# Patient Record
Sex: Female | Born: 1980
Health system: Southern US, Community
[De-identification: ages and names within clinical notes are randomized; demographics above are authoritative.]

## PROBLEM LIST (undated history)

## (undated) DIAGNOSIS — N200 Calculus of kidney: Secondary | ICD-10-CM

## (undated) DIAGNOSIS — K602 Anal fissure, unspecified: Secondary | ICD-10-CM

## (undated) HISTORY — DX: Anal fissure, unspecified: K60.2

## (undated) HISTORY — DX: Calculus of kidney: N20.0

---
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
## 2005-01-31 DIAGNOSIS — N200 Calculus of kidney: Secondary | ICD-10-CM | POA: Insufficient documentation

## 2005-01-31 HISTORY — DX: Calculus of kidney: N20.0

## 2005-08-31 DIAGNOSIS — N200 Calculus of kidney: Secondary | ICD-10-CM

## 2005-08-31 HISTORY — DX: Calculus of kidney: N20.0

## 2009-10-08 ENCOUNTER — Ambulatory Visit (HOSPITAL_COMMUNITY): Admission: RE | Admit: 2009-10-08 | Discharge: 2009-10-08 | Payer: Self-pay | Admitting: Obstetrics and Gynecology

## 2010-02-21 ENCOUNTER — Encounter: Payer: Self-pay | Admitting: Obstetrics and Gynecology

## 2010-03-20 ENCOUNTER — Inpatient Hospital Stay (HOSPITAL_COMMUNITY)
Admission: AD | Admit: 2010-03-20 | Discharge: 2010-03-21 | Disposition: A | Discharge status: Home / Self Care | Payer: BC Managed Care – PPO | Source: Ambulatory Visit | Attending: Obstetrics and Gynecology | Admitting: Obstetrics and Gynecology

## 2010-03-20 DIAGNOSIS — O4700 False labor before 37 completed weeks of gestation, unspecified trimester: Secondary | ICD-10-CM | POA: Insufficient documentation

## 2010-03-21 LAB — CBC
HCT: 38.1 % (ref 36.0–46.0)
Hemoglobin: 13 g/dL (ref 12.0–15.0)
MCV: 92.3 fL (ref 78.0–100.0)
RDW: 13 % (ref 11.5–15.5)
WBC: 14.6 10*3/uL — ABNORMAL HIGH (ref 4.0–10.5)

## 2010-03-21 LAB — URINALYSIS, ROUTINE W REFLEX MICROSCOPIC
Ketones, ur: NEGATIVE mg/dL
Nitrite: NEGATIVE
Protein, ur: NEGATIVE mg/dL
Urine Glucose, Fasting: NEGATIVE mg/dL
pH: 5 (ref 5.0–8.0)

## 2010-03-21 LAB — COMPREHENSIVE METABOLIC PANEL
ALT: 9 U/L (ref 0–35)
Alkaline Phosphatase: 96 U/L (ref 39–117)
BUN: 8 mg/dL (ref 6–23)
CO2: 22 mEq/L (ref 19–32)
GFR calc non Af Amer: >60 mL/min (ref >60)
Glucose, Bld: 94 mg/dL (ref 70–99)
Potassium: 3.9 mEq/L (ref 3.5–5.1)
Sodium: 135 mEq/L (ref 135–145)
Total Bilirubin: 0.2 mg/dL — ABNORMAL LOW (ref 0.3–1.2)

## 2010-03-21 LAB — URINE MICROSCOPIC-ADD ON

## 2010-03-21 LAB — URIC ACID: Uric Acid, Serum: 4.6 mg/dL (ref 2.4–7.0)

## 2010-03-21 LAB — WET PREP, GENITAL: Yeast Wet Prep HPF POC: NONE SEEN

## 2010-04-08 ENCOUNTER — Inpatient Hospital Stay (HOSPITAL_COMMUNITY)
Admission: AD | Admit: 2010-04-08 | Discharge: 2010-04-09 | DRG: 886 | Disposition: A | Discharge status: Home / Self Care | Payer: BC Managed Care – PPO | Source: Ambulatory Visit | Attending: Obstetrics and Gynecology | Admitting: Obstetrics and Gynecology

## 2010-04-08 DIAGNOSIS — O139 Gestational [pregnancy-induced] hypertension without significant proteinuria, unspecified trimester: Principal | ICD-10-CM | POA: Diagnosis present

## 2010-04-08 LAB — COMPREHENSIVE METABOLIC PANEL
ALT: 11 U/L (ref 0–35)
AST: 21 U/L (ref 0–37)
Albumin: 2.9 g/dL — ABNORMAL LOW (ref 3.5–5.2)
Alkaline Phosphatase: 102 U/L (ref 39–117)
Calcium: 9.1 mg/dL (ref 8.4–10.5)
GFR calc Af Amer: >60 mL/min (ref >60)
Glucose, Bld: 114 mg/dL — ABNORMAL HIGH (ref 70–99)
Potassium: 3.8 mEq/L (ref 3.5–5.1)
Sodium: 135 mEq/L (ref 135–145)
Total Protein: 5.9 g/dL — ABNORMAL LOW (ref 6.0–8.3)

## 2010-04-08 LAB — CBC
MCHC: 33.4 g/dL (ref 30.0–36.0)
Platelets: 181 10*3/uL (ref 150–400)
RDW: 12.8 % (ref 11.5–15.5)
WBC: 11 10*3/uL — ABNORMAL HIGH (ref 4.0–10.5)

## 2010-04-08 LAB — LACTATE DEHYDROGENASE: LDH: 122 U/L (ref 94–250)

## 2010-04-09 LAB — COMPREHENSIVE METABOLIC PANEL
ALT: 14 U/L (ref 0–35)
AST: 17 U/L (ref 0–37)
CO2: 23 mEq/L (ref 19–32)
Chloride: 106 mEq/L (ref 96–112)
Creatinine, Ser: 0.61 mg/dL (ref 0.4–1.2)
GFR calc Af Amer: >60 mL/min (ref >60)
GFR calc non Af Amer: >60 mL/min (ref >60)
Glucose, Bld: 75 mg/dL (ref 70–99)
Sodium: 135 mEq/L (ref 135–145)
Total Bilirubin: 0.7 mg/dL (ref 0.3–1.2)

## 2010-04-09 LAB — CBC
HCT: 39.2 % (ref 36.0–46.0)
Hemoglobin: 13.2 g/dL (ref 12.0–15.0)
MCH: 31.2 pg (ref 26.0–34.0)
RBC: 4.23 MIL/uL (ref 3.87–5.11)

## 2010-04-09 LAB — CREATININE CLEARANCE, URINE, 24 HOUR
Creatinine Clearance: 191 mL/min — ABNORMAL HIGH (ref 75–115)
Creatinine, 24H Ur: 1677 mg/d (ref 700–1800)

## 2010-04-09 LAB — PROTEIN, URINE, 24 HOUR
Collection Interval-UPROT: 24 hours
Protein, Urine: 9 mg/dL

## 2010-04-09 LAB — LACTATE DEHYDROGENASE: LDH: 114 U/L (ref 94–250)

## 2010-04-13 NOTE — Discharge Summary (Signed)
  NAME:  Heather Wallace, PETSCH                 ACCOUNT NO.:  [id number]  MEDICAL RECORD NO.:  [id number]           PATIENT TYPE:  I  LOCATION:  9154                          FACILITY:  [facility]  PHYSICIAN:  Sherron Monday, MD        DATE OF BIRTH:  [birth year]  DATE OF ADMISSION:  04/08/2010 DATE OF DISCHARGE:  04/09/2010                              DISCHARGE SUMMARY   ADMITTING DIAGNOSIS:  Elevated blood pressure in the office for rule out preeclampsia.  DISCHARGE DIAGNOSIS:  Pregnancy-induced hypertension  HISTORY OF PRESENT ILLNESS:  A 30 year old G2, P 0-1-0-1 at 73 and 3 by LMP with an EDC of May 03, 2010, seen by myself in the office with elevated blood pressures, admitted for labs and 24-hour urine.  She does complain of a dull headache.  She has received 17-hydroxyprogesterone caproate throughout her pregnancy for prior preterm delivery.  She is on p.r.n. Procardia for contractions as well.  PAST MEDICAL HISTORY:  Significant for kidney stones.  PAST SURGICAL HISTORY:  Significant for wisdom tooth extraction.  PAST GYN HISTORY:  G1 with a vaginal delivery at 35 weeks after 5-pound 8-ounce infant. G2 present  MEDICATIONS:  Prenatal vitamins.  ALLERGIES:  CECLOR which causes swelling.  SOCIAL HISTORY:  Denies alcohol, tobacco, or drug use.  FAMILY HISTORY:  In the prenatal records.  On admission, she had a benign exam with much improved blood pressures in the hospital.  Her lab evaluation was normal.  She was admitted and had a 24-hour urine.  Her 24-hour urine was within normal limits with total protein of 160 mg.  Following this, she was discharged to home with routine discharge instructions and numbers to call if any questions or problems as well as instructions to follow up on Monday and no longer work.  She voiced understanding to this.     Sherron Monday, MD    JB/MEDQ  D:  04/10/2010  T:  04/10/2010  Job:  [id number]  Electronically Signed by Sherron Monday MD on 04/13/2010  01:50:03 PM

## 2010-04-15 ENCOUNTER — Inpatient Hospital Stay (HOSPITAL_COMMUNITY)
Admission: AD | Admit: 2010-04-15 | Discharge: 2010-04-16 | DRG: 373 | Disposition: A | Discharge status: Home / Self Care | Payer: BC Managed Care – PPO | Source: Ambulatory Visit | Attending: Obstetrics and Gynecology | Admitting: Obstetrics and Gynecology

## 2010-04-15 LAB — CBC
MCH: 31.7 pg (ref 26.0–34.0)
MCHC: 34.3 g/dL (ref 30.0–36.0)
Platelets: 173 10*3/uL (ref 150–400)
RDW: 12.7 % (ref 11.5–15.5)

## 2010-04-15 LAB — COMPREHENSIVE METABOLIC PANEL
AST: 25 U/L (ref 0–37)
Albumin: 2.9 g/dL — ABNORMAL LOW (ref 3.5–5.2)
Calcium: 8.9 mg/dL (ref 8.4–10.5)
Creatinine, Ser: 0.63 mg/dL (ref 0.4–1.2)
GFR calc Af Amer: >60 mL/min (ref >60)
GFR calc non Af Amer: >60 mL/min (ref >60)
Total Protein: 5.8 g/dL — ABNORMAL LOW (ref 6.0–8.3)

## 2010-04-15 LAB — RPR: RPR Ser Ql: NONREACTIVE

## 2010-04-16 LAB — CBC
HCT: 35.2 % — ABNORMAL LOW (ref 36.0–46.0)
Hemoglobin: 11.7 g/dL — ABNORMAL LOW (ref 12.0–15.0)
MCH: 31.3 pg (ref 26.0–34.0)
MCHC: 33.2 g/dL (ref 30.0–36.0)
MCV: 94.1 fL (ref 78.0–100.0)
Platelets: 151 10*3/uL (ref 150–400)
RBC: 3.74 MIL/uL — ABNORMAL LOW (ref 3.87–5.11)
RDW: 12.9 % (ref 11.5–15.5)
WBC: 13.1 10*3/uL — ABNORMAL HIGH (ref 4.0–10.5)

## 2010-05-03 ENCOUNTER — Inpatient Hospital Stay (HOSPITAL_COMMUNITY): Admission: AD | Admit: 2010-05-03 | Payer: Self-pay | Admitting: Obstetrics and Gynecology

## 2010-05-04 ENCOUNTER — Inpatient Hospital Stay (HOSPITAL_COMMUNITY)
Admission: AD | Admit: 2010-05-04 | Discharge: 2010-05-04 | Disposition: A | Discharge status: Home / Self Care | Payer: BC Managed Care – PPO | Source: Ambulatory Visit | Attending: Obstetrics and Gynecology | Admitting: Obstetrics and Gynecology

## 2010-05-04 DIAGNOSIS — O99893 Other specified diseases and conditions complicating puerperium: Secondary | ICD-10-CM | POA: Insufficient documentation

## 2010-05-04 DIAGNOSIS — H7090 Unspecified mastoiditis, unspecified ear: Secondary | ICD-10-CM | POA: Insufficient documentation

## 2010-05-04 DIAGNOSIS — O9229 Other disorders of breast associated with pregnancy and the puerperium: Secondary | ICD-10-CM | POA: Insufficient documentation

## 2010-05-04 LAB — COMPREHENSIVE METABOLIC PANEL
Alkaline Phosphatase: 91 U/L (ref 39–117)
BUN: 6 mg/dL (ref 6–23)
Chloride: 103 mEq/L (ref 96–112)
Glucose, Bld: 126 mg/dL — ABNORMAL HIGH (ref 70–99)
Potassium: 3.5 mEq/L (ref 3.5–5.1)
Total Bilirubin: 0.6 mg/dL (ref 0.3–1.2)
Total Protein: 7.2 g/dL (ref 6.0–8.3)

## 2010-05-04 LAB — URINALYSIS, ROUTINE W REFLEX MICROSCOPIC
Ketones, ur: 15 mg/dL — AB
Leukocytes, UA: NEGATIVE
Nitrite: NEGATIVE
Protein, ur: 30 mg/dL — AB
Urobilinogen, UA: 2 mg/dL — ABNORMAL HIGH (ref 0.0–1.0)

## 2010-05-04 LAB — CBC
HCT: 41.6 % (ref 36.0–46.0)
MCV: 92.2 fL (ref 78.0–100.0)
RDW: 12 % (ref 11.5–15.5)
WBC: 7.5 10*3/uL (ref 4.0–10.5)

## 2010-05-04 LAB — DIFFERENTIAL
Eosinophils Relative: 0 % (ref 0–5)
Lymphocytes Relative: 7 % — ABNORMAL LOW (ref 12–46)
Lymphs Abs: 0.5 10*3/uL — ABNORMAL LOW (ref 0.7–4.0)

## 2010-05-04 LAB — URIC ACID: Uric Acid, Serum: 3.8 mg/dL (ref 2.4–7.0)

## 2010-05-06 ENCOUNTER — Other Ambulatory Visit: Payer: Self-pay | Admitting: Obstetrics and Gynecology

## 2010-05-06 ENCOUNTER — Ambulatory Visit
Admission: RE | Admit: 2010-05-06 | Discharge: 2010-05-06 | Disposition: A | Discharge status: Home / Self Care | Payer: BC Managed Care – PPO | Source: Ambulatory Visit | Attending: Obstetrics and Gynecology | Admitting: Obstetrics and Gynecology

## 2010-05-06 DIAGNOSIS — N63 Unspecified lump in unspecified breast: Secondary | ICD-10-CM

## 2010-05-06 DIAGNOSIS — N61 Mastitis without abscess: Secondary | ICD-10-CM

## 2010-05-20 ENCOUNTER — Ambulatory Visit
Admission: RE | Admit: 2010-05-20 | Discharge: 2010-05-20 | Disposition: A | Discharge status: Home / Self Care | Payer: BC Managed Care – PPO | Source: Ambulatory Visit | Attending: Obstetrics and Gynecology | Admitting: Obstetrics and Gynecology

## 2010-05-20 DIAGNOSIS — N61 Mastitis without abscess: Secondary | ICD-10-CM

## 2010-06-08 NOTE — Discharge Summary (Signed)
  NAME:  Heather Wallace, GLAAB                 ACCOUNT NO.:  [id number]  MEDICAL RECORD NO.:  [id number]           PATIENT TYPE:  I  LOCATION:  9133                          [facility]ACILITY:  [facility]  PHYSICIAN:  Sherron Monday, MD        DATE OF BIRTH:  [birth year]  DATE OF ADMISSION:  04/15/2010 DATE OF DISCHARGE:  04/16/2010                              DISCHARGE SUMMARY   ADMITTING DIAGNOSIS:  Intrauterine pregnancy at term with spontaneous rupture of membranes.  DISCHARGE DIAGNOSIS:  Intrauterine pregnancy at term with spontaneous rupture of membranes, delivered.  HISTORY OF PRESENT ILLNESS:  A 30 year old G2, P 1-0-0-1 at 37 plus weeks with spontaneous rupture of membranes.  She was recently admitted for preeclampsia rule out.  Throughout her pregnancy she has been receiving 17-hydroxyprogesterone caproate for history of an early delivery.  She has had loss of fluid at 5 o'clock this morning with clear fluid, no vaginal bleeding, occasional contractions, and good fetal movement.  PAST MEDICAL HISTORY:  Significant for renal calculi.  PAST SURGICAL HISTORY:  Significant for wisdom tooth extraction.  PAST OB/GYN HISTORY:  G1 was a 35-week female, infant after a PROM, 5 pounds 8 ounces.  G2 is present pregnancy.  No history of any abnormal Pap smears or sexually transmitted diseases.  MEDICATIONS:  Prenatal vitamins and Aldomet.  ALLERGIES:  CECLOR.  SOCIAL HISTORY:  Denies alcohol, tobacco, or drug use.  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  FAMILY HISTORY:  Significant for breast cancer and hypertension. Ultrasound at 18 weeks revealed normal anatomy, posterior placenta, and a female infant.  PRENATAL LABORATORIES:  She is A positive, antibody screen negative, hemoglobin 14.0.  Pap within normal limits, rubella immune, RPR nonreactive.  Urine culture negative, hepatitis C surface antigen negative, HIV negative, platelets 248,000.  Gonorrhea negative. Chlamydia negative.  Cystic fibrosis screen  negative.  First trimester screen AFP within normal limits.  Glucola of 96.  Group B strep was negative.  Her pressures were mildly elevated and were monitored, and was given Pitocin to augment her labor as well as an epidural for pain. She progressed rapidly to complete +3, pushed 3-4 contractions to deliver a viable female infant at 27 on May 16, 2010.  Apgars were 9 at 1 minute and 9 at 5 minutes and weight was 6 pounds 14 ounces. Placenta was expressed intact after cord blood collection.  Her postpartum course was relatively uncomplicated.  Hemoglobin decreased from 13.1 to 11.7.  She desired discharge to home on postpartum day #1. She was discharged home with routine discharge instructions including numbers to call with any questions or problems, as well as prescription for Motrin, Vicodin, and prenatal vitamins.  DISCHARGE INFORMATION:  A positive, rubella immune, breast-feeding.  She plans to start progesterone pills for contraception at her 6-week checkup.  Hemoglobin decreased from 13.1 to 11.7.     Sherron Monday, MD  JB/MEDQ  D:  05/19/2010  T:  05/20/2010  Job:  [id number]  Electronically Signed by Sherron Monday MD on 06/08/2010 08:45:52 AM

## 2010-09-13 ENCOUNTER — Ambulatory Visit (INDEPENDENT_AMBULATORY_CARE_PROVIDER_SITE_OTHER): Payer: BC Managed Care – PPO | Admitting: Surgery

## 2010-09-13 ENCOUNTER — Encounter (INDEPENDENT_AMBULATORY_CARE_PROVIDER_SITE_OTHER): Payer: Self-pay | Admitting: Surgery

## 2010-09-13 VITALS — BP 142/90 | HR 96 | Temp 98.1°F | Ht 66.5 in | Wt 153.4 lb

## 2010-09-13 DIAGNOSIS — K602 Anal fissure, unspecified: Secondary | ICD-10-CM

## 2010-09-13 DIAGNOSIS — K600 Acute anal fissure: Secondary | ICD-10-CM | POA: Insufficient documentation

## 2010-09-13 NOTE — Progress Notes (Signed)
Subjective:     Patient ID: Heather Wallace, female   DOB: [birth year], 30 y.o.   MRN: [id number]  HPI  This is a pleasant 30 year old female who is postpartum. She has been having sharp spasmodic rectal discomfort with bowel movements. She has also had blood when she wipes. She has tried Anusol cream without help. She is on a stool softener. She has no issues with continence. She has had no previous problems with anal discomfort. She is otherwise healthy. Review of Systems     Objective:   Physical Exam    On examination, her external anal skin is normal. There are no external hemorrhoids. I performed a digital examination. Her sphincter was tight and there is a question of a posterior anal fissure. I could not get in deep enough to fill her internal hemorrhoids. Assessment:     Symptomatic anal fissure    Plan:     I will try her on diltiazem cream, sitz baths, and MiraLax. Hopefully this will improve without need for sphincterotomy. I will see her back in 3-4 weeks.

## 2010-09-15 ENCOUNTER — Telehealth (INDEPENDENT_AMBULATORY_CARE_PROVIDER_SITE_OTHER): Payer: Self-pay | Admitting: General Surgery

## 2010-09-15 NOTE — Telephone Encounter (Signed)
Called in  Rx for Polyethylene Glycol # 527 gram to [facility] to [facility] fax # [phone] phone # [phone] Pt stated that it was cheaper then OTC

## 2010-10-12 ENCOUNTER — Ambulatory Visit (INDEPENDENT_AMBULATORY_CARE_PROVIDER_SITE_OTHER): Payer: BC Managed Care – PPO | Admitting: Surgery

## 2010-10-12 ENCOUNTER — Encounter (INDEPENDENT_AMBULATORY_CARE_PROVIDER_SITE_OTHER): Payer: Self-pay | Admitting: Surgery

## 2010-10-12 VITALS — BP 136/94 | HR 76

## 2010-10-12 DIAGNOSIS — K602 Anal fissure, unspecified: Secondary | ICD-10-CM

## 2010-10-12 NOTE — Progress Notes (Signed)
Subjective:     Patient ID: Heather Wallace, female   DOB: 1980-02-02, 30 y.o.   MRN: [id number]  HPI She is here for a followup of an anal fissure. She reports that her symptoms have resolved and she is doing her stool softeners and the diltiazem cream.  Review of Systems     Objective:   Physical Exam    On examination, the external perianal skin is normal. I deferred a digital examination to avoid discomfort Assessment:     Patient with healing anal fissure on conservative measures.    Plan:     She will stop the diltiazem cream when it runs out. I will see her back if she recurs. If not I will see her as needed

## 2011-05-26 DIAGNOSIS — R809 Proteinuria, unspecified: Secondary | ICD-10-CM

## 2011-05-26 HISTORY — DX: Proteinuria, unspecified: R80.9

## 2011-06-21 IMAGING — US US OB COMP LESS 14 WK
1 series · 14 of 19 positions shown · non-contrast
Comparison: none

OBSTETRICAL ULTRASOUND:
 This ultrasound exam was performed in the [HOSPITAL] Ultrasound Department.  The OB US report was generated in the AS system, and faxed to the ordering physician.  This report is also available in [HOSPITAL]?s Access[facility]ware and in [REDACTED] PACS.

[Series 1: us ob comp less 14 wks · 0.21mm/px · 14 of 19 slices shown]
[im 1/19]
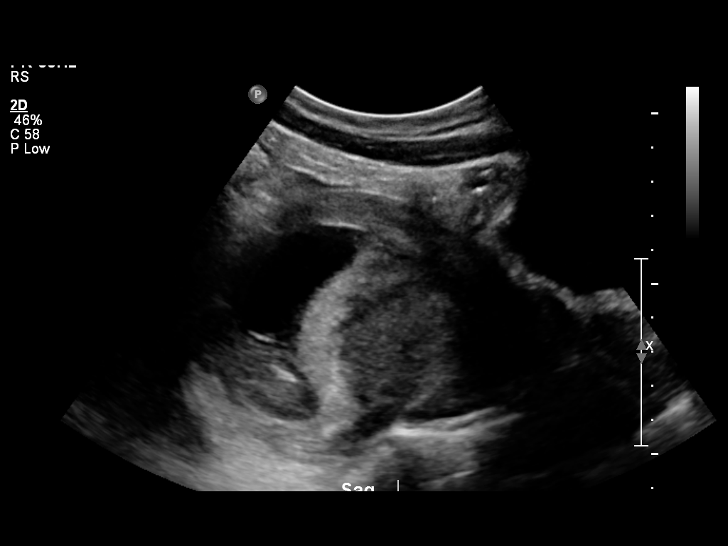
[im 3/19]
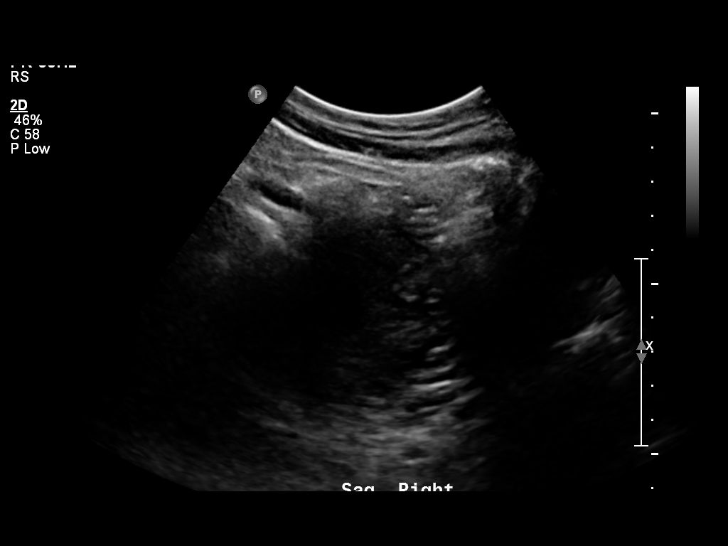
[im 4/19]
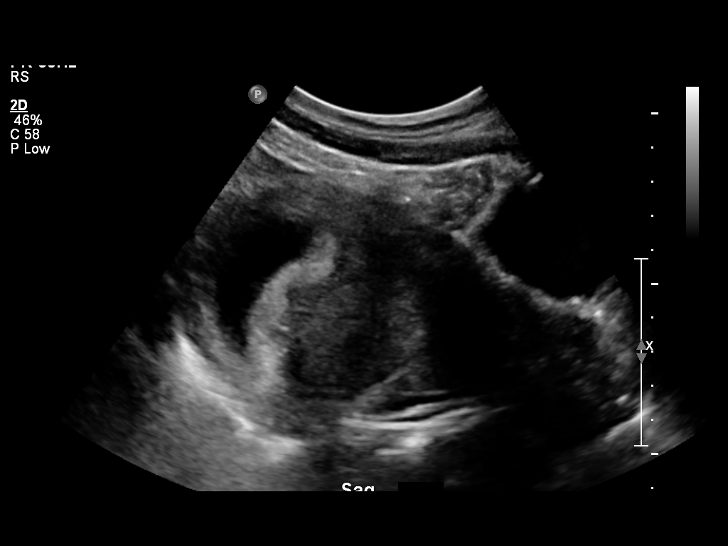
[im 5/19]
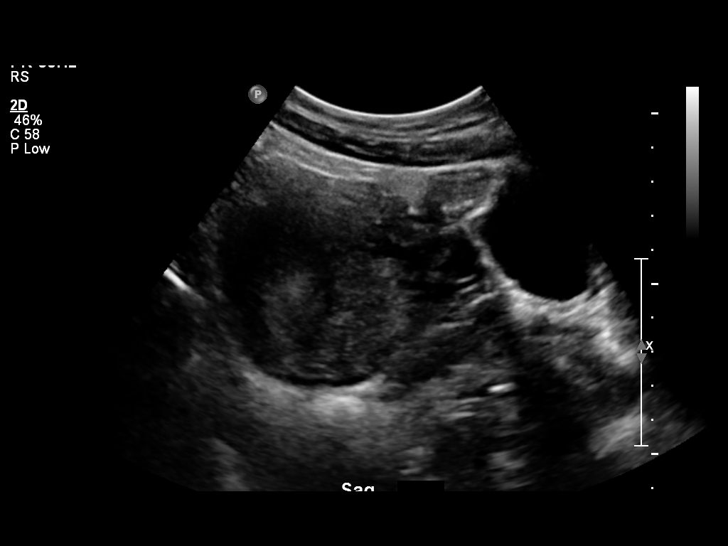
[im 7/19]
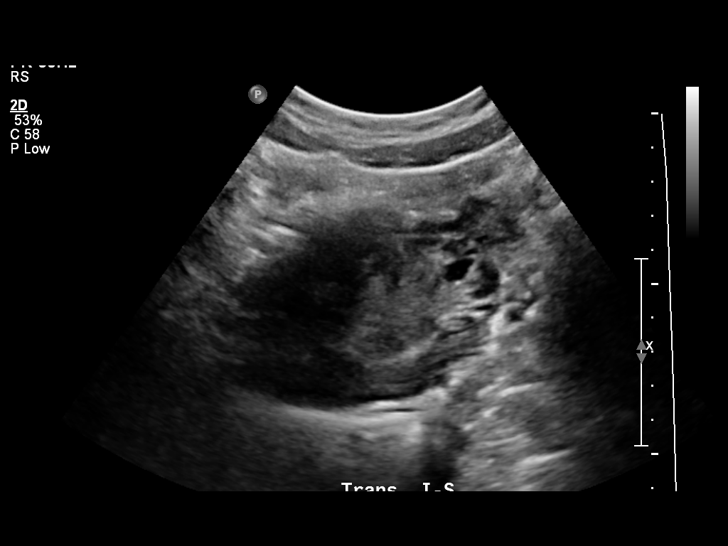
[im 8/19]
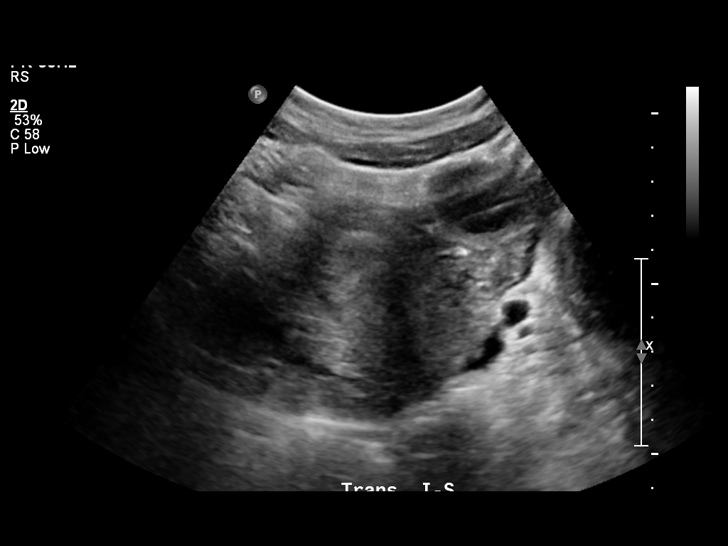
[im 9/19]
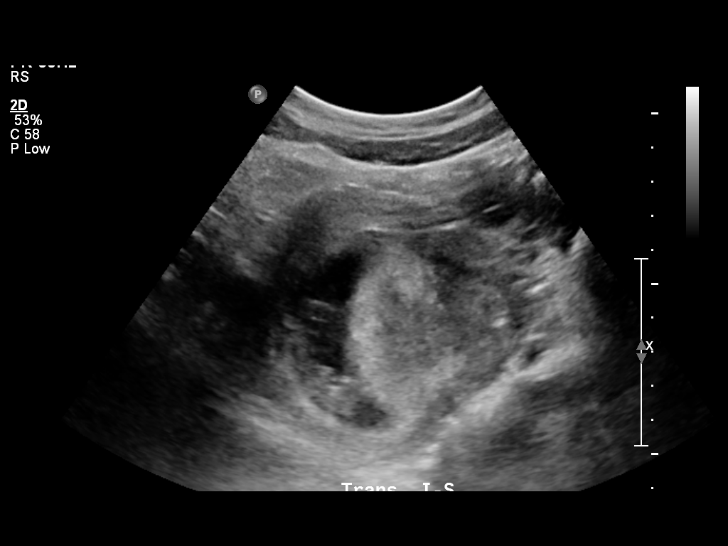
[im 11/19]
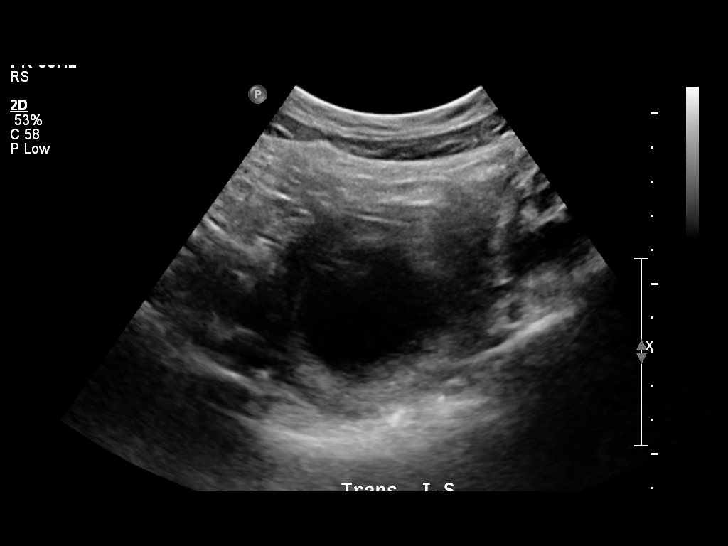
[im 12/19]
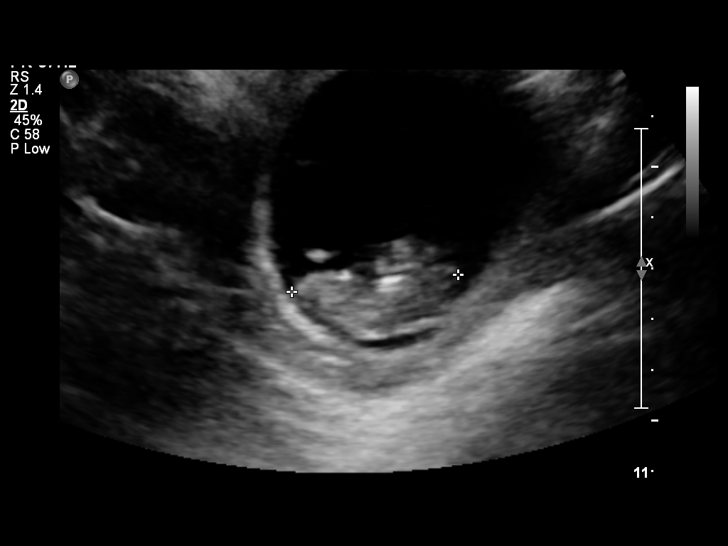
[im 13/19]
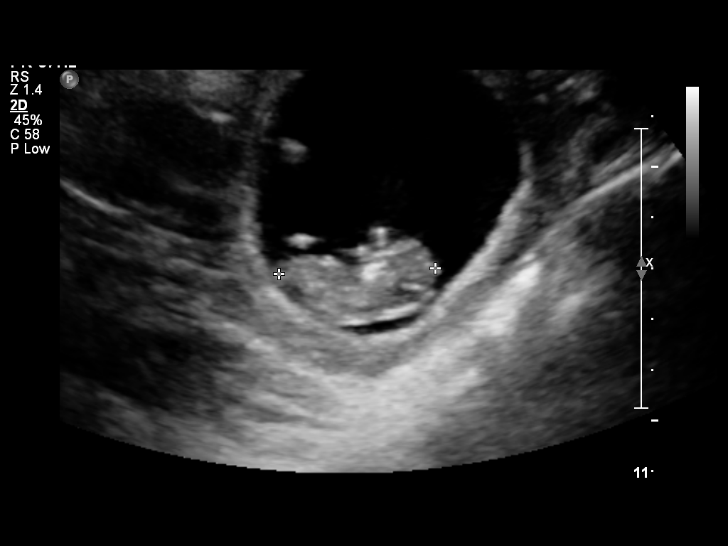
[im 15/19]
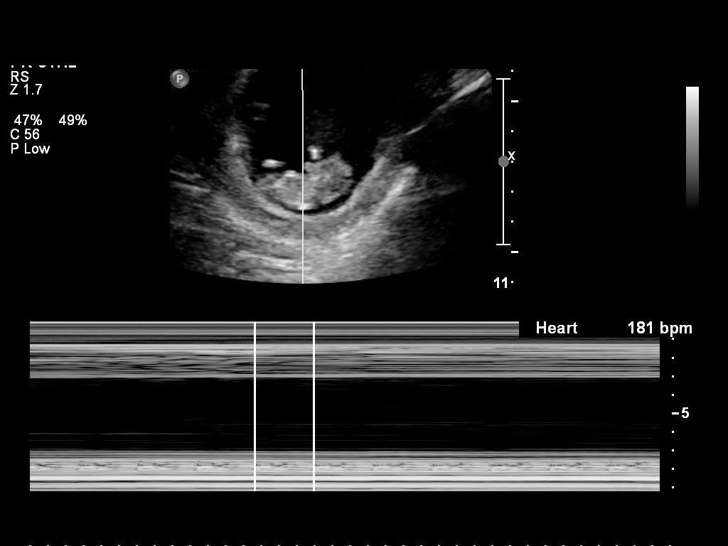
[im 16/19]
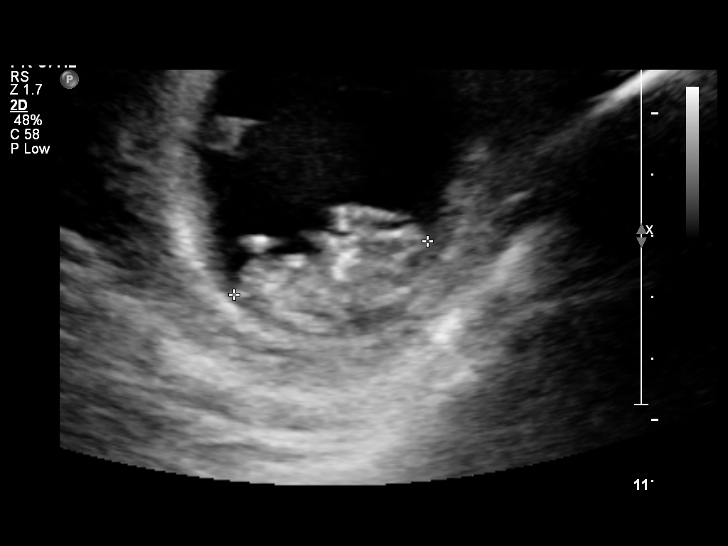
[im 17/19]
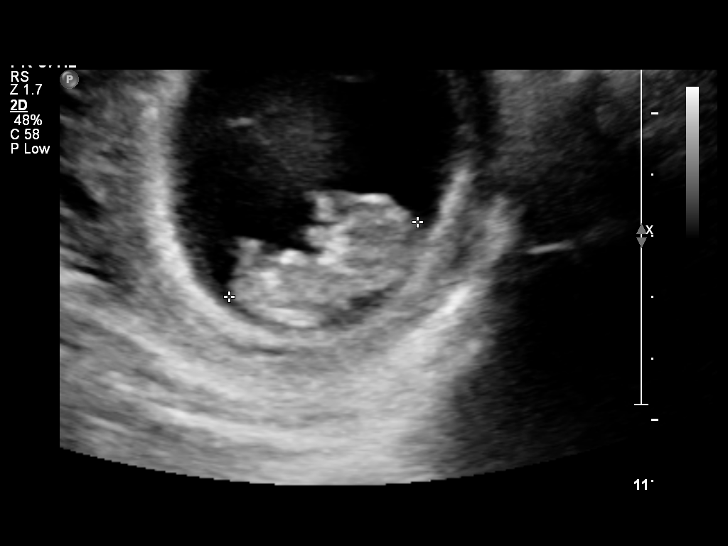
[im 19/19]
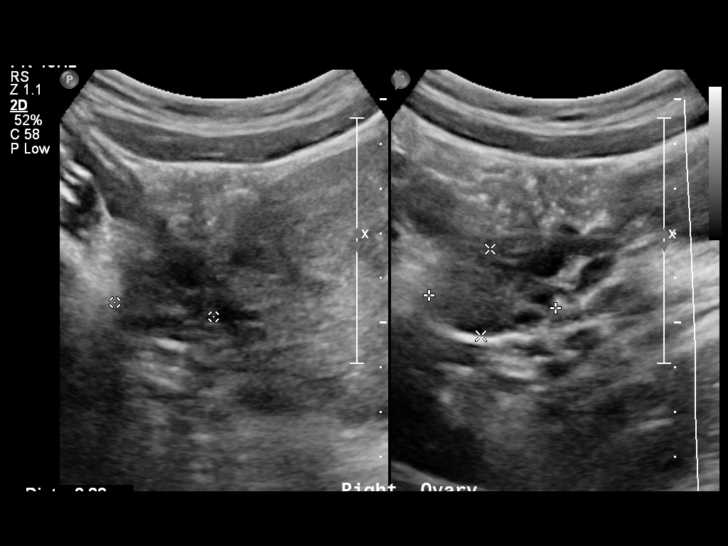

[14 of 19 positions shown; findings below may reference images not displayed]

IMPRESSION: See AS Obstetric US report.

## 2011-11-23 DIAGNOSIS — E785 Hyperlipidemia, unspecified: Secondary | ICD-10-CM | POA: Insufficient documentation

## 2016-04-25 DIAGNOSIS — I10 Essential (primary) hypertension: Secondary | ICD-10-CM | POA: Diagnosis not present

## 2016-04-25 DIAGNOSIS — Z01419 Encounter for gynecological examination (general) (routine) without abnormal findings: Secondary | ICD-10-CM | POA: Diagnosis not present

## 2016-04-25 DIAGNOSIS — Z6824 Body mass index (BMI) 24.0-24.9, adult: Secondary | ICD-10-CM | POA: Diagnosis not present

## 2016-07-06 DIAGNOSIS — I87393 Chronic venous hypertension (idiopathic) with other complications of bilateral lower extremity: Secondary | ICD-10-CM | POA: Diagnosis not present

## 2016-09-01 DIAGNOSIS — D225 Melanocytic nevi of trunk: Secondary | ICD-10-CM | POA: Diagnosis not present

## 2016-09-01 DIAGNOSIS — D485 Neoplasm of uncertain behavior of skin: Secondary | ICD-10-CM | POA: Diagnosis not present

## 2016-09-01 DIAGNOSIS — D2261 Melanocytic nevi of right upper limb, including shoulder: Secondary | ICD-10-CM | POA: Diagnosis not present

## 2016-09-01 DIAGNOSIS — L812 Freckles: Secondary | ICD-10-CM | POA: Diagnosis not present

## 2016-09-01 DIAGNOSIS — D2262 Melanocytic nevi of left upper limb, including shoulder: Secondary | ICD-10-CM | POA: Diagnosis not present

## 2016-11-14 ENCOUNTER — Other Ambulatory Visit: Payer: Self-pay | Admitting: Internal Medicine

## 2016-11-15 ENCOUNTER — Encounter: Payer: Self-pay | Admitting: Internal Medicine

## 2017-01-30 ENCOUNTER — Other Ambulatory Visit: Payer: Self-pay | Admitting: Internal Medicine

## 2017-01-30 DIAGNOSIS — Z1321 Encounter for screening for nutritional disorder: Secondary | ICD-10-CM

## 2017-01-30 DIAGNOSIS — Z0000 Encounter for general adult medical examination without abnormal findings: Secondary | ICD-10-CM

## 2017-01-30 DIAGNOSIS — Z1329 Encounter for screening for other suspected endocrine disorder: Secondary | ICD-10-CM

## 2017-01-30 DIAGNOSIS — Z13220 Encounter for screening for lipoid disorders: Secondary | ICD-10-CM

## 2017-02-13 ENCOUNTER — Other Ambulatory Visit: Payer: 59 | Admitting: Internal Medicine

## 2017-02-13 DIAGNOSIS — Z1329 Encounter for screening for other suspected endocrine disorder: Secondary | ICD-10-CM | POA: Diagnosis not present

## 2017-02-13 DIAGNOSIS — Z0000 Encounter for general adult medical examination without abnormal findings: Secondary | ICD-10-CM | POA: Diagnosis not present

## 2017-02-13 DIAGNOSIS — Z13220 Encounter for screening for lipoid disorders: Secondary | ICD-10-CM

## 2017-02-13 DIAGNOSIS — Z1321 Encounter for screening for nutritional disorder: Secondary | ICD-10-CM | POA: Diagnosis not present

## 2017-02-14 ENCOUNTER — Ambulatory Visit (INDEPENDENT_AMBULATORY_CARE_PROVIDER_SITE_OTHER): Payer: 59 | Admitting: Internal Medicine

## 2017-02-14 ENCOUNTER — Encounter: Payer: Self-pay | Admitting: Internal Medicine

## 2017-02-14 VITALS — BP 148/90 | HR 75 | Ht 66.0 in | Wt 155.0 lb

## 2017-02-14 DIAGNOSIS — Z0000 Encounter for general adult medical examination without abnormal findings: Secondary | ICD-10-CM | POA: Diagnosis not present

## 2017-02-14 DIAGNOSIS — R8290 Unspecified abnormal findings in urine: Secondary | ICD-10-CM

## 2017-02-14 DIAGNOSIS — I10 Essential (primary) hypertension: Secondary | ICD-10-CM | POA: Diagnosis not present

## 2017-02-14 LAB — CBC WITH DIFFERENTIAL/PLATELET
BASOS PCT: 0.3 %
Basophils Absolute: 23 cells/uL (ref 0–200)
EOS PCT: 1.3 %
Eosinophils Absolute: 99 cells/uL (ref 15–500)
HCT: 45 % (ref 35.0–45.0)
HEMOGLOBIN: 15.1 g/dL (ref 11.7–15.5)
Lymphs Abs: 1391 cells/uL (ref 850–3900)
MCH: 30.6 pg (ref 27.0–33.0)
MCHC: 33.6 g/dL (ref 32.0–36.0)
MCV: 91.3 fL (ref 80.0–100.0)
MONOS PCT: 6.1 %
MPV: 11.5 fL (ref 7.5–12.5)
NEUTROS ABS: 5624 {cells}/uL (ref 1500–7800)
Neutrophils Relative %: 74 %
PLATELETS: 247 10*3/uL (ref 140–400)
RBC: 4.93 10*6/uL (ref 3.80–5.10)
RDW: 11.7 % (ref 11.0–15.0)
TOTAL LYMPHOCYTE: 18.3 %
WBC mixed population: 464 cells/uL (ref 200–950)
WBC: 7.6 10*3/uL (ref 3.8–10.8)

## 2017-02-14 LAB — LIPID PANEL
CHOL/HDL RATIO: 2.9 (calc) (ref <5.0)
CHOLESTEROL: 160 mg/dL (ref <200)
HDL: 55 mg/dL (ref >50)
LDL CHOLESTEROL (CALC): 90 mg/dL
NON-HDL CHOLESTEROL (CALC): 105 mg/dL (ref <130)
TRIGLYCERIDES: 63 mg/dL (ref <150)

## 2017-02-14 LAB — VITAMIN D 25 HYDROXY (VIT D DEFICIENCY, FRACTURES): Vit D, 25-Hydroxy: 32 ng/mL (ref 30–100)

## 2017-02-14 LAB — POCT URINALYSIS DIPSTICK
APPEARANCE: NORMAL
BILIRUBIN UA: NEGATIVE
Glucose, UA: NEGATIVE
Ketones, UA: NEGATIVE
Nitrite, UA: NEGATIVE
ODOR: NORMAL
PH UA: 8 (ref 5.0–8.0)
PROTEIN UA: NEGATIVE
Spec Grav, UA: 1.01 (ref 1.010–1.025)
UROBILINOGEN UA: 0.2 U/dL

## 2017-02-14 LAB — COMPLETE METABOLIC PANEL WITH GFR
AG RATIO: 1.6 (calc) (ref 1.0–2.5)
ALKALINE PHOSPHATASE (APISO): 54 U/L (ref 33–115)
ALT: 9 U/L (ref 6–29)
AST: 13 U/L (ref 10–30)
Albumin: 4.5 g/dL (ref 3.6–5.1)
BILIRUBIN TOTAL: 0.5 mg/dL (ref 0.2–1.2)
BUN: 13 mg/dL (ref 7–25)
CHLORIDE: 103 mmol/L (ref 98–110)
CO2: 29 mmol/L (ref 20–32)
Calcium: 9.5 mg/dL (ref 8.6–10.2)
Creat: 0.74 mg/dL (ref 0.50–1.10)
GFR, Est African American: 122 mL/min/{1.73_m2} (ref >=60)
GFR, Est Non African American: 105 mL/min/{1.73_m2} (ref >=60)
GLOBULIN: 2.9 g/dL (ref 1.9–3.7)
Glucose, Bld: 80 mg/dL (ref 65–99)
POTASSIUM: 4.4 mmol/L (ref 3.5–5.3)
SODIUM: 138 mmol/L (ref 135–146)
Total Protein: 7.4 g/dL (ref 6.1–8.1)

## 2017-02-14 LAB — TSH: TSH: 0.68 mIU/L

## 2017-02-14 MED ORDER — AMLODIPINE BESYLATE 5 MG PO TABS: 5.0000 mg | ORAL_TABLET | Freq: Every day | ORAL | 3 refills | Status: DC

## 2017-02-14 NOTE — Patient Instructions (Addendum)
It was a pleasure to see you today.  Start Norvasc 5 mg daily and follow-up in 4 weeks.  Please have renal artery duplex scan in the near future.

## 2017-02-14 NOTE — Progress Notes (Signed)
Subjective:    Patient ID: Heather Wallace, female    DOB: 03-30-1980, 37 y.o.   MRN: [id number]  HPI  37 year old Female presents for the first time for health maintenance exam and evaluation of medical issues.  She has a history of elevated blood pressure started in her early 75s.  At that time she was on oral contraceptives for dysmenorrhea and menorrhagia.  She was in school at the time.  Oral contraceptives were stopped for a brief period of time but she continued to have significant menorrhagia restarted.  Her blood pressure was monitored.  She also had pregnancy-induced hypertension.  Her GYN has her blood pressure remains elevated.  She is comfortable with her on oral contraceptives for symptom management for birth control as her husband has had a vasectomy if patient will take antihypertensive medication.  Past history: History of kidney stone which she was traveling in [place].  Is never had recurrence.  Social history: She is married.  Husband is a Careers information officer.  She has a Transport planner.D degree from [employer] and works at [employer].  Family history: Both parents living age 59 in good health.  Father has hypertension and history of kidney stones and gout.  Brother age 15 in good health but has allergic rhinitis.  No sisters.  2 children daughter age 20 and a son age 48.  Mother is Artist.   Last year was training for a 5K marathon.  Since the holidays is not really been exercising.  No known drug allergies  Is currently on Junel FE 1/20 oral contraceptives and as needed Motrin.         Review of Systems  Respiratory: Negative.   Cardiovascular: Negative.   Gastrointestinal: Negative.   Genitourinary: Negative.   Neurological: Negative.   Psychiatric/Behavioral: Negative.        Objective:   Physical Exam  Constitutional: She is oriented to person, place, and time. She appears well-developed and well-nourished. No distress.  HENT:  Head: Normocephalic and  atraumatic.  Right Ear: External ear normal.  Left Ear: External ear normal.  Mouth/Throat: Oropharynx is clear and moist.  Eyes: Conjunctivae and EOM are normal. Pupils are equal, round, and reactive to light. Right eye exhibits no discharge. Left eye exhibits no discharge. No scleral icterus.  Neck: Neck supple. No JVD present. No thyromegaly present.  Cardiovascular: Normal rate, regular rhythm, normal heart sounds and intact distal pulses.  No murmur heard. Pulmonary/Chest: Effort normal and breath sounds normal. No respiratory distress. She has no wheezes. She has no rales.  Abdominal: Soft. Bowel sounds are normal. She exhibits no distension and no mass. There is no tenderness. There is no rebound and no guarding.  Genitourinary:  Genitourinary Comments: Deferred to GYN  Musculoskeletal: She exhibits no edema.  Lymphadenopathy:    She has no cervical adenopathy.  Neurological: She is alert and oriented to person, place, and time. She has normal reflexes. No cranial nerve deficit. Coordination normal.  Skin: Skin is warm and dry. No rash noted. She is not diaphoretic.  Psychiatric: She has a normal mood and affect. Her behavior is normal. Judgment and thought content normal.  Vitals reviewed.         Assessment & Plan:  Long-standing history of hypertension.  Initially was on oral contraceptives at the time of diagnosis.  Was reluctant to come off due to issues with menorrhagia.  Had pregnancy-induced hypertension as well.  Has never had workup  for renal artery stenosis.  Was young at onset of hypertension.  Recommend renal duplex scan.  Recommend Norvasc 5 mg daily and follow-up in 4 weeks.  She has had flu vaccine through employment.

## 2017-02-15 LAB — URINE CULTURE
MICRO NUMBER: 90060162
SPECIMEN QUALITY:: ADEQUATE

## 2017-02-21 ENCOUNTER — Encounter (HOSPITAL_COMMUNITY): Payer: Self-pay

## 2017-02-28 ENCOUNTER — Encounter (HOSPITAL_COMMUNITY): Payer: Self-pay

## 2017-02-28 ENCOUNTER — Ambulatory Visit (HOSPITAL_COMMUNITY)
Admission: RE | Admit: 2017-02-28 | Discharge: 2017-02-28 | Disposition: A | Discharge status: Home / Self Care | Payer: 59 | Source: Ambulatory Visit | Attending: Internal Medicine | Admitting: Internal Medicine

## 2017-02-28 DIAGNOSIS — I10 Essential (primary) hypertension: Secondary | ICD-10-CM

## 2017-02-28 NOTE — Progress Notes (Signed)
Renal artery duplex has been completed. Negative for obvious evidence of renal artery stenosis bilaterally.  02/28/17 9:39 AM Heather Wallace RVT

## 2017-03-16 ENCOUNTER — Encounter: Payer: Self-pay | Admitting: Internal Medicine

## 2017-03-16 ENCOUNTER — Ambulatory Visit (INDEPENDENT_AMBULATORY_CARE_PROVIDER_SITE_OTHER): Payer: 59 | Admitting: Internal Medicine

## 2017-03-16 VITALS — BP 148/90 | HR 88 | Ht 66.0 in | Wt 156.0 lb

## 2017-03-16 DIAGNOSIS — I10 Essential (primary) hypertension: Secondary | ICD-10-CM | POA: Diagnosis not present

## 2017-03-16 NOTE — Progress Notes (Signed)
   Subjective:    Patient ID: Heather Wallace, female    DOB: 1980/03/17, 37 y.o.   MRN: [id number]  HPI On January 29, had negative renal artery duplex study. Has been taking amlodipine regularly without side effects. Has not taken BP at home or work. Very busy at work with flu season.    Review of Systems see above     Objective:   Physical Exam BP  148/90 on arrival Rechecked 138/88       Assessment & Plan:  Could have element of office HTN with essential hypertension. She will monitor BP at home next 2-3 weeks and will follow up in March. She does not want to take diuretic because of potential for urinary frequency. If I increase amlodipine, she may experience leg edema especially since she is on her feet all day at work.

## 2017-03-16 NOTE — Patient Instructions (Signed)
Continue Norvasc and RTC in early March for follow up. Take BP at home

## 2017-04-24 ENCOUNTER — Encounter: Payer: Self-pay | Admitting: Internal Medicine

## 2017-04-24 ENCOUNTER — Ambulatory Visit: Payer: 59 | Admitting: Internal Medicine

## 2017-04-24 ENCOUNTER — Ambulatory Visit (INDEPENDENT_AMBULATORY_CARE_PROVIDER_SITE_OTHER): Payer: 59 | Admitting: Internal Medicine

## 2017-04-24 VITALS — BP 140/100 | HR 107 | Ht 66.0 in | Wt 148.0 lb

## 2017-04-24 DIAGNOSIS — I10 Essential (primary) hypertension: Secondary | ICD-10-CM

## 2017-04-24 NOTE — Progress Notes (Signed)
   Subjective:    Patient ID: Heather Wallace, female    DOB: April 02, 1980, 37 y.o.   MRN: [id number]  HPI She is off work today.  She ran 3 miles this morning.  Blood pressure was elevated on arrival at 140/100 with pulse of 107.  I would like to add something to amlodipine 5 mg daily.  Apply double amlodipine I am afraid she will have dependent edema.  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  She did run out of her blood pressure medication for a few days and the readings she brings them were elevated during that time but more recently over the last 3 days have been pretty normal.    Review of Systems     Objective:   Physical Exam Not examined today but spent 15 minutes speaking with her about blood pressure control and what options are available.  Talked about Bystolic.  She does run weekly about 3 miles.  Would like to run more but does not have time.       Assessment & Plan:  Essential hypertension  Plan: She will continue to keep an eye on her blood pressure and try not to run out of medication.  She will call in a couple of weeks with blood pressure readings.

## 2017-04-24 NOTE — Patient Instructions (Signed)
Continue Norvasc 5 mg daily and keep blood pressure readings.  Call with readings in a couple of weeks.

## 2017-06-15 DIAGNOSIS — Z01419 Encounter for gynecological examination (general) (routine) without abnormal findings: Secondary | ICD-10-CM | POA: Diagnosis not present

## 2017-06-15 DIAGNOSIS — Z1389 Encounter for screening for other disorder: Secondary | ICD-10-CM | POA: Diagnosis not present

## 2017-06-15 DIAGNOSIS — Z6824 Body mass index (BMI) 24.0-24.9, adult: Secondary | ICD-10-CM | POA: Diagnosis not present

## 2017-10-18 DIAGNOSIS — D225 Melanocytic nevi of trunk: Secondary | ICD-10-CM | POA: Diagnosis not present

## 2017-10-18 DIAGNOSIS — D2239 Melanocytic nevi of other parts of face: Secondary | ICD-10-CM | POA: Diagnosis not present

## 2017-10-18 DIAGNOSIS — L918 Other hypertrophic disorders of the skin: Secondary | ICD-10-CM | POA: Diagnosis not present

## 2018-01-11 ENCOUNTER — Other Ambulatory Visit: Payer: Self-pay | Admitting: Internal Medicine

## 2018-01-11 ENCOUNTER — Other Ambulatory Visit: Payer: Self-pay

## 2018-01-11 MED ORDER — AMLODIPINE BESYLATE 5 MG PO TABS: 5.0000 mg | ORAL_TABLET | Freq: Every day | ORAL | 0 refills | Status: DC

## 2018-01-11 NOTE — Telephone Encounter (Signed)
Call in for 90 days

## 2018-03-05 ENCOUNTER — Ambulatory Visit (INDEPENDENT_AMBULATORY_CARE_PROVIDER_SITE_OTHER): Payer: 59 | Admitting: Internal Medicine

## 2018-03-05 ENCOUNTER — Encounter: Payer: Self-pay | Admitting: Internal Medicine

## 2018-03-05 VITALS — BP 150/90 | HR 88 | Ht 66.0 in | Wt 148.0 lb

## 2018-03-05 DIAGNOSIS — Z1329 Encounter for screening for other suspected endocrine disorder: Secondary | ICD-10-CM | POA: Diagnosis not present

## 2018-03-05 DIAGNOSIS — Z13220 Encounter for screening for lipoid disorders: Secondary | ICD-10-CM

## 2018-03-05 DIAGNOSIS — I10 Essential (primary) hypertension: Secondary | ICD-10-CM | POA: Diagnosis not present

## 2018-03-05 DIAGNOSIS — Z0000 Encounter for general adult medical examination without abnormal findings: Secondary | ICD-10-CM

## 2018-03-05 DIAGNOSIS — E559 Vitamin D deficiency, unspecified: Secondary | ICD-10-CM

## 2018-03-05 NOTE — Patient Instructions (Signed)
Keep CPE appt.

## 2018-03-06 LAB — LIPID PANEL
CHOL/HDL RATIO: 2.8 (calc) (ref <5.0)
CHOLESTEROL: 173 mg/dL (ref <200)
HDL: 61 mg/dL (ref >=50)
LDL Cholesterol (Calc): 94 mg/dL (calc)
Non-HDL Cholesterol (Calc): 112 mg/dL (calc) (ref <130)
TRIGLYCERIDES: 88 mg/dL (ref <150)

## 2018-03-06 LAB — CBC WITH DIFFERENTIAL/PLATELET
Absolute Monocytes: 400 cells/uL (ref 200–950)
BASOS ABS: 28 {cells}/uL (ref 0–200)
Basophils Relative: 0.4 %
EOS ABS: 117 {cells}/uL (ref 15–500)
Eosinophils Relative: 1.7 %
HCT: 42.7 % (ref 35.0–45.0)
Hemoglobin: 14.4 g/dL (ref 11.7–15.5)
Lymphs Abs: 1325 cells/uL (ref 850–3900)
MCH: 30.8 pg (ref 27.0–33.0)
MCHC: 33.7 g/dL (ref 32.0–36.0)
MCV: 91.2 fL (ref 80.0–100.0)
MONOS PCT: 5.8 %
MPV: 11.5 fL (ref 7.5–12.5)
Neutro Abs: 5030 cells/uL (ref 1500–7800)
Neutrophils Relative %: 72.9 %
PLATELETS: 226 10*3/uL (ref 140–400)
RBC: 4.68 10*6/uL (ref 3.80–5.10)
RDW: 12 % (ref 11.0–15.0)
TOTAL LYMPHOCYTE: 19.2 %
WBC: 6.9 10*3/uL (ref 3.8–10.8)

## 2018-03-06 LAB — VITAMIN D 25 HYDROXY (VIT D DEFICIENCY, FRACTURES): VIT D 25 HYDROXY: 22 ng/mL — AB (ref 30–100)

## 2018-03-06 LAB — TSH: TSH: 0.76 mIU/L

## 2018-03-26 ENCOUNTER — Encounter: Payer: Self-pay | Admitting: Internal Medicine

## 2018-03-26 ENCOUNTER — Ambulatory Visit (INDEPENDENT_AMBULATORY_CARE_PROVIDER_SITE_OTHER): Payer: 59 | Admitting: Internal Medicine

## 2018-03-26 VITALS — BP 140/90 | HR 93 | Ht 66.0 in | Wt 161.0 lb

## 2018-03-26 DIAGNOSIS — E559 Vitamin D deficiency, unspecified: Secondary | ICD-10-CM | POA: Diagnosis not present

## 2018-03-26 DIAGNOSIS — I10 Essential (primary) hypertension: Secondary | ICD-10-CM | POA: Diagnosis not present

## 2018-03-26 DIAGNOSIS — Z0000 Encounter for general adult medical examination without abnormal findings: Secondary | ICD-10-CM

## 2018-03-26 DIAGNOSIS — IMO0001 Reserved for inherently not codable concepts without codable children: Secondary | ICD-10-CM | POA: Insufficient documentation

## 2018-03-26 DIAGNOSIS — Z8751 Personal history of pre-term labor: Secondary | ICD-10-CM

## 2018-03-26 HISTORY — DX: Personal history of pre-term labor: Z87.51

## 2018-03-26 LAB — POCT URINALYSIS DIPSTICK
APPEARANCE: NEGATIVE
BILIRUBIN UA: NEGATIVE
Glucose, UA: NEGATIVE
Ketones, UA: NEGATIVE
Nitrite, UA: NEGATIVE
Odor: NEGATIVE
PH UA: 7 (ref 5.0–8.0)
Protein, UA: NEGATIVE
SPEC GRAV UA: 1.015 (ref 1.010–1.025)
UROBILINOGEN UA: 0.2 U/dL

## 2018-03-26 MED ORDER — OLMESARTAN MEDOXOMIL 20 MG PO TABS: 20.0000 mg | ORAL_TABLET | Freq: Every day | ORAL | 1 refills | Status: DC

## 2018-03-26 NOTE — Progress Notes (Signed)
Subjective:    Patient ID: Heather Wallace, female    DOB: 06/06/1980, 38 y.o.   MRN: [id number]  HPI 38 year old Female in today for health maintenance exam and evaluation of medical issues.  Had tetanus immunization 2010.  Had flu vaccine August 2019.  History of elevated blood pressure starting in her early 35s.  At that time she was on oral contraceptives for dysmenorrhea and menorrhagia.  She was in school at that time.  Oral contraceptives were stopped for a brief period of time but she continued to have significant menorrhagia so these were restarted.  Her blood pressure was monitored.  She had pregnancy-induced hypertension.  Dr. Melba Coon is GYN physician.  Had Pap in 2017.  History of Lasik eye surgery and wisdom teeth extraction.  Currently on Junel 1/20 oral contraceptives to manage dysmenorrhea and menorrhagia.  History of kidney stone while she was traveling in [place].  Has never had recurrence.  Social history: She is married.  Husband is a Careers information officer.  She has a Transport planner.D. degree from [employer] and works at [employer]  Family history: Both parents living age 76 in good health.  Father has hypertension and history of kidney stones as well as gout.  Brother late 42s in good health but has allergic rhinitis.  No sisters.  2 children -daughter age 31 and a son age 23.  Patient's mother is Heather Wallace who is also a patient here.  No known drug allergies  In January 2019 was started on Norvasc for elevated blood pressure 148/90.  She tolerated this well.  She had a negative renal artery duplex study.  She was seen again in March of blood pressure was still elevated on amlodipine at 148/100.  She did not want to take a diuretic.  She runs frequently.  Did not want to have urinary frequency or volume depletion.  Labs reviewed-she has vitamin D deficiency with a level being 22.  Lipid panel and TSH are normal.  CBC is normal.  Has 2+ LE on urine dipstick but is  asymptomatic. Culture was not sent Review of Systems see above-     Objective:   Physical Exam Vitals signs reviewed.  Constitutional:      General: She is not in acute distress.    Appearance: Normal appearance.  HENT:     Head: Normocephalic.     Right Ear: Tympanic membrane normal.     Left Ear: Tympanic membrane normal.     Nose: Nose normal.     Mouth/Throat:     Mouth: Mucous membranes are moist.     Pharynx: Oropharynx is clear.  Eyes:     General: No scleral icterus.    Conjunctiva/sclera: Conjunctivae normal.     Pupils: Pupils are equal, round, and reactive to light.  Neck:     Musculoskeletal: Neck supple. No neck rigidity.     Comments: No thyromegaly Cardiovascular:     Rate and Rhythm: Normal rate and regular rhythm.     Pulses: Normal pulses.     Heart sounds: Normal heart sounds. No murmur.  Pulmonary:     Effort: No respiratory distress.     Breath sounds: Normal breath sounds. No wheezing.     Comments: Breast without masses Abdominal:     General: Bowel sounds are normal.     Palpations: Abdomen is soft. There is no mass.     Tenderness: There is no guarding.  Genitourinary:    Comments:  Deferred to GYN Musculoskeletal:     Right lower leg: No edema.     Left lower leg: No edema.  Lymphadenopathy:     Cervical: No cervical adenopathy.  Skin:    General: Skin is warm and dry.  Neurological:     General: No focal deficit present.     Mental Status: She is alert.  Psychiatric:        Mood and Affect: Mood normal.        Behavior: Behavior normal.        Thought Content: Thought content normal.    Blood pressure 140/90.  BMI 25.99 weight 161 pounds.  Pulse 93.  Pulse oximetry 97%.       Assessment & Plan:  Persistently elevated blood pressure over the past year on amlodipine 5 mg daily.  Discussion with patient about adding another agent.  We have agreed on Benicar 20 mg daily in addition to amlodipine 5 mg daily.  She really does not want  to be on a diuretic.  She will return in mid March for follow-up.  She is to keep track of blood pressures at home.  Take vitamin D supplement.

## 2018-03-26 NOTE — Addendum Note (Signed)
Addended by: Mady Haagensen on: 03/26/2018 11:19 AM   Modules accepted: Orders

## 2018-03-26 NOTE — Addendum Note (Signed)
Addended by: Mady Haagensen on: 03/26/2018 11:26 AM   Modules accepted: Orders

## 2018-03-31 NOTE — Patient Instructions (Signed)
Take vitamin D supplement.  Add olmesartan to Norvasc and follow-up mid March.

## 2018-04-16 ENCOUNTER — Encounter: Payer: Self-pay | Admitting: Internal Medicine

## 2018-04-16 ENCOUNTER — Other Ambulatory Visit: Payer: Self-pay

## 2018-04-16 ENCOUNTER — Ambulatory Visit (INDEPENDENT_AMBULATORY_CARE_PROVIDER_SITE_OTHER): Payer: 59 | Admitting: Internal Medicine

## 2018-04-16 VITALS — BP 142/102 | HR 105 | Ht 66.0 in | Wt 161.0 lb

## 2018-04-16 DIAGNOSIS — I10 Essential (primary) hypertension: Secondary | ICD-10-CM | POA: Diagnosis not present

## 2018-04-16 NOTE — Patient Instructions (Signed)
Continue current medication. Continue to moniotr BP at home and work. Reassess in 3 months- can reach me by phone or e-chart if needed.

## 2018-04-30 NOTE — Progress Notes (Signed)
   Subjective:    Patient ID: Heather Wallace, female    DOB: [birth year], 38 y.o.   MRN: [id number]  HPI Here today to follow-up on elevated blood pressure.  He was here February 24 for routine health maintenance exam.  At that time blood pressure was 140/90.  She has been maintained on amlodipine 5 mg daily for the past year.  During that visit in February we added Benicar 20 mg daily. Discussion regarding management of hypertension.  She feels it may have been elevated at home.  However, there is anxiety surrounding COVID-19 outbreak.  Work is stressful.  She does not want to be on a diuretic.   Review of Systems no headache     Objective:   Physical Exam blood pressure reading  today is 142/102 with pulse of 105 Chest clear.  Cardiac exam regular rate and rhythm.      Assessment & Plan:  Essential hypertension  Elevated blood pressure reading  Mild tachycardia-?  Anxiety  Plan: We can increase Benicar to 40 mg daily.  She will consider it.  For now we are not going to make any changes.  She will stay on Benicar 20 mg daily and Norvasc 5 mg daily.  I would like for her to purchase a home blood pressure monitoring take it at home.  Otherwise she can take it during work hours.  Needs to follow-up with me in about 3 months.  Otherwise can be in touch with me by a chart or phone.

## 2018-09-17 ENCOUNTER — Other Ambulatory Visit: Payer: Self-pay | Admitting: Internal Medicine

## 2019-03-21 ENCOUNTER — Other Ambulatory Visit: Payer: Self-pay | Admitting: Internal Medicine

## 2019-11-01 ENCOUNTER — Other Ambulatory Visit (HOSPITAL_BASED_OUTPATIENT_CLINIC_OR_DEPARTMENT_OTHER): Payer: Self-pay | Admitting: Internal Medicine

## 2019-11-01 MED FILL — FLUARIX QUADRIVALENT 0.5 ML: 0.5 | 1 days supply | Qty: 1 | Fill #0

## 2019-11-25 MED FILL — BLISOVI FE 1/20 1-20 MG-MCG: 1-20 | 84 days supply | Qty: 112 | Fill #0

## 2019-12-06 ENCOUNTER — Other Ambulatory Visit: Payer: Self-pay | Admitting: Internal Medicine

## 2019-12-06 NOTE — Telephone Encounter (Signed)
Scheduled appt with patient 12/12/19

## 2019-12-06 NOTE — Telephone Encounter (Signed)
Please call her. She is a Engineer, maintenance (IT). Have not seen her recently. schedule appt before refilling

## 2019-12-06 NOTE — Telephone Encounter (Signed)
PE due February

## 2019-12-12 ENCOUNTER — Ambulatory Visit: Payer: 59 | Admitting: Internal Medicine

## 2019-12-17 ENCOUNTER — Encounter: Payer: Self-pay | Admitting: Internal Medicine

## 2019-12-17 ENCOUNTER — Ambulatory Visit (INDEPENDENT_AMBULATORY_CARE_PROVIDER_SITE_OTHER): Payer: 59 | Admitting: Internal Medicine

## 2019-12-17 ENCOUNTER — Other Ambulatory Visit: Payer: Self-pay | Admitting: Internal Medicine

## 2019-12-17 ENCOUNTER — Other Ambulatory Visit: Payer: Self-pay

## 2019-12-17 VITALS — BP 120/90 | HR 74 | Wt 166.0 lb

## 2019-12-17 DIAGNOSIS — I10 Essential (primary) hypertension: Secondary | ICD-10-CM

## 2019-12-17 MED ORDER — OLMESARTAN MEDOXOMIL-HCTZ 20-12.5 MG PO TABS: 1.0000 | ORAL_TABLET | Freq: Every day | ORAL | 0 refills | Status: DC

## 2019-12-17 MED FILL — OLMESARTAN-HCTZ 20-12.5 MG: 20-12.5 | 90 days supply | Qty: 90 | Fill #0

## 2019-12-17 NOTE — Progress Notes (Signed)
   Subjective:    Patient ID: Heather Wallace, female    DOB: 1980-04-06, 39 y.o.   MRN: [id number]  HPI 39 year old Female seen for follow up on HTN. She has been a patient in this practice since January 2019. History of elevated blood pressure that started in her 18s. At that time she was on oral contraceptives for dysmenorrhea and menorrhagia. She was also in school at the time. Oral contraceptives were stopped for a brief period of time but she continued to have significant menorrhagia and had to resume OCP. Her blood pressure was monitored. She also had pregnancy-induced hypertension.  There is a family history of hypertension in her father. Mother is Betha Loa, who is also a patient in this practice.  History of running 5K marathons.  Remains on low-dose oral contraceptive-Junel FE 1/20  Blood pressure controlled with amlodipine 5 mg daily and Benicar HCTZ generic 20/12.5 daily.  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 She has a Transport planner.D. degree from [employer]. She is married. Husband is a Careers information officer.  Had renal artery duplex study in 2019 - for renal artery stenosis bilaterally  Review of Systems-no new complaints     Objective:   Physical Exam Blood pressure 120/90 pulse 74 pulse oximetry 97% weight 166 pounds.  Skin warm and dry. No carotid bruits. No thyromegaly. Chest clear to auscultation. Cardiac exam regular rate and rhythm normal S1 and S2. No lower extremity pitting edema.      Assessment & Plan:  Asked patient to monitor her blood pressure more frequently. Diastolic is slightly elevated today at 90 current regimen  Plan: She has an upcoming CPE appointment in February. We can reassess her blood pressure control at that time. New prescription for olmesartan HCTZ 20/12.5 daily and follow-up in February. Monitor blood pressure at home and let me know if persistently  elevated.

## 2019-12-20 ENCOUNTER — Other Ambulatory Visit: Payer: Self-pay | Admitting: Internal Medicine

## 2019-12-22 NOTE — Patient Instructions (Signed)
New prescription for olmesartan HCTZ 20/12.5 daily. Follow-up in February with CPE and fasting labs. Monitor blood pressure at home and let me know if persistently elevated. It was a pleasure to see you today.

## 2019-12-30 ENCOUNTER — Other Ambulatory Visit: Payer: 59 | Admitting: Internal Medicine

## 2019-12-30 ENCOUNTER — Other Ambulatory Visit: Payer: Self-pay

## 2019-12-30 DIAGNOSIS — I10 Essential (primary) hypertension: Secondary | ICD-10-CM | POA: Diagnosis not present

## 2019-12-30 DIAGNOSIS — Z5181 Encounter for therapeutic drug level monitoring: Secondary | ICD-10-CM | POA: Diagnosis not present

## 2019-12-30 DIAGNOSIS — Z79899 Other long term (current) drug therapy: Secondary | ICD-10-CM

## 2019-12-31 LAB — BASIC METABOLIC PANEL
BUN: 13 mg/dL (ref 7–25)
CO2: 22 mmol/L (ref 20–32)
Calcium: 9.8 mg/dL (ref 8.6–10.2)
Chloride: 100 mmol/L (ref 98–110)
Creat: 0.77 mg/dL (ref 0.50–1.10)
Glucose, Bld: 77 mg/dL (ref 65–99)
Potassium: 4.1 mmol/L (ref 3.5–5.3)
Sodium: 135 mmol/L (ref 135–146)

## 2020-01-29 DIAGNOSIS — Z20822 Contact with and (suspected) exposure to covid-19: Secondary | ICD-10-CM | POA: Diagnosis not present

## 2020-01-29 DIAGNOSIS — Z03818 Encounter for observation for suspected exposure to other biological agents ruled out: Secondary | ICD-10-CM | POA: Diagnosis not present

## 2020-03-06 ENCOUNTER — Other Ambulatory Visit: Payer: Self-pay

## 2020-03-06 ENCOUNTER — Other Ambulatory Visit: Payer: 59 | Admitting: Internal Medicine

## 2020-03-06 DIAGNOSIS — Z0000 Encounter for general adult medical examination without abnormal findings: Secondary | ICD-10-CM

## 2020-03-06 DIAGNOSIS — I10 Essential (primary) hypertension: Secondary | ICD-10-CM

## 2020-03-06 DIAGNOSIS — Z1321 Encounter for screening for nutritional disorder: Secondary | ICD-10-CM | POA: Diagnosis not present

## 2020-03-06 DIAGNOSIS — E559 Vitamin D deficiency, unspecified: Secondary | ICD-10-CM | POA: Diagnosis not present

## 2020-03-07 LAB — COMPLETE METABOLIC PANEL WITH GFR
AG Ratio: 1.6 (calc) (ref 1.0–2.5)
ALT: 11 U/L (ref 6–29)
AST: 12 U/L (ref 10–30)
Albumin: 4.3 g/dL (ref 3.6–5.1)
Alkaline phosphatase (APISO): 57 U/L (ref 31–125)
BUN: 12 mg/dL (ref 7–25)
CO2: 24 mmol/L (ref 20–32)
Calcium: 9.5 mg/dL (ref 8.6–10.2)
Chloride: 103 mmol/L (ref 98–110)
Creat: 0.74 mg/dL (ref 0.50–1.10)
GFR, Est African American: 119 mL/min/{1.73_m2} (ref >=60)
GFR, Est Non African American: 103 mL/min/{1.73_m2} (ref >=60)
Globulin: 2.7 g/dL (calc) (ref 1.9–3.7)
Glucose, Bld: 83 mg/dL (ref 65–99)
Potassium: 4 mmol/L (ref 3.5–5.3)
Sodium: 138 mmol/L (ref 135–146)
Total Bilirubin: 0.6 mg/dL (ref 0.2–1.2)
Total Protein: 7 g/dL (ref 6.1–8.1)

## 2020-03-07 LAB — CBC WITH DIFFERENTIAL/PLATELET
Absolute Monocytes: 493 cells/uL (ref 200–950)
Basophils Absolute: 23 cells/uL (ref 0–200)
Basophils Relative: 0.3 %
Eosinophils Absolute: 77 cells/uL (ref 15–500)
Eosinophils Relative: 1 %
HCT: 41.3 % (ref 35.0–45.0)
Hemoglobin: 14 g/dL (ref 11.7–15.5)
Lymphs Abs: 1432 cells/uL (ref 850–3900)
MCH: 31 pg (ref 27.0–33.0)
MCHC: 33.9 g/dL (ref 32.0–36.0)
MCV: 91.6 fL (ref 80.0–100.0)
MPV: 11.6 fL (ref 7.5–12.5)
Monocytes Relative: 6.4 %
Neutro Abs: 5675 cells/uL (ref 1500–7800)
Neutrophils Relative %: 73.7 %
Platelets: 225 10*3/uL (ref 140–400)
RBC: 4.51 10*6/uL (ref 3.80–5.10)
RDW: 11.7 % (ref 11.0–15.0)
Total Lymphocyte: 18.6 %
WBC: 7.7 10*3/uL (ref 3.8–10.8)

## 2020-03-07 LAB — LIPID PANEL
Cholesterol: 176 mg/dL (ref <200)
HDL: 53 mg/dL (ref >=50)
LDL Cholesterol (Calc): 107 mg/dL (calc) — ABNORMAL HIGH
Non-HDL Cholesterol (Calc): 123 mg/dL (calc) (ref <130)
Total CHOL/HDL Ratio: 3.3 (calc) (ref <5.0)
Triglycerides: 69 mg/dL (ref <150)

## 2020-03-07 LAB — TSH: TSH: 0.6 mIU/L

## 2020-03-07 LAB — VITAMIN D 25 HYDROXY (VIT D DEFICIENCY, FRACTURES): Vit D, 25-Hydroxy: 22 ng/mL — ABNORMAL LOW (ref 30–100)

## 2020-03-09 ENCOUNTER — Other Ambulatory Visit: Payer: Self-pay | Admitting: Internal Medicine

## 2020-03-09 ENCOUNTER — Encounter: Payer: Self-pay | Admitting: Internal Medicine

## 2020-03-09 ENCOUNTER — Other Ambulatory Visit: Payer: Self-pay

## 2020-03-09 ENCOUNTER — Ambulatory Visit (INDEPENDENT_AMBULATORY_CARE_PROVIDER_SITE_OTHER): Payer: 59 | Admitting: Internal Medicine

## 2020-03-09 VITALS — BP 120/90 | HR 76 | Wt 170.0 lb

## 2020-03-09 DIAGNOSIS — E559 Vitamin D deficiency, unspecified: Secondary | ICD-10-CM | POA: Diagnosis not present

## 2020-03-09 DIAGNOSIS — I10 Essential (primary) hypertension: Secondary | ICD-10-CM | POA: Diagnosis not present

## 2020-03-09 DIAGNOSIS — R8290 Unspecified abnormal findings in urine: Secondary | ICD-10-CM | POA: Diagnosis not present

## 2020-03-09 DIAGNOSIS — Z0000 Encounter for general adult medical examination without abnormal findings: Secondary | ICD-10-CM

## 2020-03-09 LAB — POCT URINALYSIS DIPSTICK
Appearance: NEGATIVE
Bilirubin, UA: NEGATIVE
Glucose, UA: NEGATIVE
Ketones, UA: NEGATIVE
Nitrite, UA: NEGATIVE
Odor: NEGATIVE
Protein, UA: NEGATIVE
Spec Grav, UA: 1.01 (ref 1.010–1.025)
Urobilinogen, UA: 0.2 E.U./dL
pH, UA: 6.5 (ref 5.0–8.0)

## 2020-03-09 MED ORDER — OLMESARTAN MEDOXOMIL-HCTZ 40-25 MG PO TABS: 1.0000 | ORAL_TABLET | Freq: Every day | ORAL | 0 refills | Status: DC

## 2020-03-09 MED ORDER — ERGOCALCIFEROL 1.25 MG (50000 UT) PO CAPS: 50000.0000 [IU] | ORAL_CAPSULE | ORAL | 0 refills | Status: DC

## 2020-03-09 MED FILL — OLMESARTAN-HCTZ 40-25 MG TA: 40-25 | 90 days supply | Qty: 90 | Fill #0

## 2020-03-09 MED FILL — VIT D2 1.25 MG (50,000 UNIT: 1.25 MG | 84 days supply | Qty: 12 | Fill #0

## 2020-03-09 NOTE — Progress Notes (Signed)
Subjective:    Patient ID: Heather Wallace, female    DOB: 1980-03-07, 40 y.o.   MRN: [id number]  HPI 40 year old Female seen for health maintenance exam and evaluation of medical issues.  Had Covid -19 diagnosed January 10.  Recovered without sequela.  She has a history of elevated blood pressure starting in her early 70s.  At that time, she was on oral contraceptives for dysmenorrhea and menorrhagia.  She was in school at that time.  Oral contraceptives were stopped for a brief period of time but she continued to have significant menorrhagia so these were restarted.  Her blood pressure was monitored.  She also had pregnancy-induced hypertension.  Dr. Melba Coon is GYN physician.  Currently on Loestrin Fe oral contraceptives to manage dysmenorrhea and menorrhagia.  History of kidney stone while traveling in [place].  Has never had recurrence.  History of Lasix surgery and wisdom teeth extraction.  Social history: She is married.  Husband is a Careers information officer.  She has a Transport planner.D. 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  2 children, a son and a daughter   No known drug allergies.  Family history: Both parents living .  Father with history of hypertension, cancer, and history of kidney stones as well as gout.  Brother in good health but has allergic rhinitis.  No sisters.  2 children, a daughter and a son.  Patient's mother is Betha Loa who is also a patient here.  Mother has osteoporosis and hypertension.  In January 2019 she was started on Norvasc for elevated blood pressure 148/90.    She had a negative renal artery duplex study.    She was seen again in March 2019 and blood pressure was elevated on amlodipine at 148/100.  She did not want to take diuretic.  Did not want to have urinary frequency or volume depletion with her job and the fact that she runs frequently.  In February 2020, Benicar daily was added to amlodipine 5 mg daily.  Again, she did not want to be  on a diuretic.  History of vitamin D deficiency.  Level was 22 2 years ago and now is 22 as well.  We have placed her on high-dose vitamin D weekly  Has mildly elevated LDL at 107 and it was 94 last year.  Large LE on urine dipstick but culture has less than 10,000 colony-forming units per milliliter.  This may not have been a clean-catch.  Urine microscopic showed greater than 60 white blood cells per high-powered field.    Review of Systems  Constitutional: Negative.   Respiratory: Negative.   Cardiovascular: Negative.   Gastrointestinal: Negative.   Genitourinary: Negative.   Neurological: Negative.   Psychiatric/Behavioral: Negative.        Objective:   Physical Exam  Blood pressure 120/90 pulse 76 regular pulse oximetry 98% weight 170 pounds BMI 27.44  Skin: Warm and dry.  Nodes none.  No thyromegaly.  No carotid bruits.  Chest: Clear to auscultation.  Breasts: without masses.  Cardiac exam: Regular rate and rhythm normal S1 and S2.  Abdomen: Soft nondistended without hepatosplenomegaly masses or tenderness.  Extremities: Without edema or deformity.  Neuro: Intact without focal deficits.  Affect thought and judgment are normal.      Assessment & Plan:  Essential hypertension-   Blood pressure is elevated.  We have discussed this today and feel that she would benefit from increasing Benicar HCT from 20/12.5-40/25.  She is agreeable to this.  She will monitor her blood pressure and let me know how it is doing.  She  should have a basic metabolic panel  in a few weeks on this medication.  She is no longer on amlodipine.  Vitamin D deficiency-has been placed on high-dose vitamin D weekly.  Level is low at 22.  Elevated LDL at 107 and previously was normal last year.    Continue to watch diet and exercise will reevaluate in 6 months but needs B- met in a few weeks since she will be on Benicar HCTZ 40/25 daily.

## 2020-03-09 NOTE — Patient Instructions (Addendum)
Take Drisdol 50,000 units weekly x 3 months then 4000-5000 IU daily. Increase  Benicar/HCTZ to 40/25 daily. In 3 weeks, need B-met. Call us and we can try to put in order to get it at [facility]. Send me BP readings through MyChart in 2-3 weeks as well. It was a pleasure to see you today.  Otherwise follow-up in 6 months.

## 2020-03-10 LAB — URINE CULTURE
MICRO NUMBER:: 11502811
SPECIMEN QUALITY:: ADEQUATE

## 2020-03-10 LAB — URINALYSIS, MICROSCOPIC ONLY
Hyaline Cast: NONE SEEN /LPF
RBC / HPF: NONE SEEN /HPF (ref 0–2)
WBC, UA: >=60 /HPF — AB (ref 0–5)

## 2020-03-19 ENCOUNTER — Other Ambulatory Visit (HOSPITAL_BASED_OUTPATIENT_CLINIC_OR_DEPARTMENT_OTHER): Payer: Self-pay | Admitting: Obstetrics and Gynecology

## 2020-03-19 DIAGNOSIS — Z3041 Encounter for surveillance of contraceptive pills: Secondary | ICD-10-CM | POA: Diagnosis not present

## 2020-03-19 DIAGNOSIS — Z1389 Encounter for screening for other disorder: Secondary | ICD-10-CM | POA: Diagnosis not present

## 2020-03-19 DIAGNOSIS — Z01419 Encounter for gynecological examination (general) (routine) without abnormal findings: Secondary | ICD-10-CM | POA: Diagnosis not present

## 2020-03-19 DIAGNOSIS — Z6826 Body mass index (BMI) 26.0-26.9, adult: Secondary | ICD-10-CM | POA: Diagnosis not present

## 2020-03-19 DIAGNOSIS — Z124 Encounter for screening for malignant neoplasm of cervix: Secondary | ICD-10-CM | POA: Diagnosis not present

## 2020-03-19 DIAGNOSIS — Z1151 Encounter for screening for human papillomavirus (HPV): Secondary | ICD-10-CM | POA: Diagnosis not present

## 2020-03-19 MED FILL — BLISOVI FE 1/20 1-20 MG-MCG: 1-20 | 84 days supply | Qty: 112 | Fill #0

## 2020-04-20 ENCOUNTER — Other Ambulatory Visit: Payer: Self-pay

## 2020-04-20 DIAGNOSIS — I10 Essential (primary) hypertension: Secondary | ICD-10-CM

## 2020-04-20 NOTE — Telephone Encounter (Signed)
Called lab and we can put the order in for [place] and they can see it in [facility]

## 2020-04-21 ENCOUNTER — Other Ambulatory Visit (HOSPITAL_BASED_OUTPATIENT_CLINIC_OR_DEPARTMENT_OTHER)
Admission: RE | Admit: 2020-04-21 | Discharge: 2020-04-21 | Disposition: A | Discharge status: Home / Self Care | Payer: 59 | Source: Ambulatory Visit | Attending: Internal Medicine | Admitting: Internal Medicine

## 2020-04-21 ENCOUNTER — Other Ambulatory Visit: Payer: Self-pay

## 2020-04-21 DIAGNOSIS — I10 Essential (primary) hypertension: Secondary | ICD-10-CM | POA: Insufficient documentation

## 2020-04-21 LAB — BASIC METABOLIC PANEL
Anion gap: 8 (ref 5–15)
BUN: 16 mg/dL (ref 6–20)
CO2: 28 mmol/L (ref 22–32)
Calcium: 9.3 mg/dL (ref 8.9–10.3)
Chloride: 102 mmol/L (ref 98–111)
Creatinine, Ser: 0.73 mg/dL (ref 0.44–1.00)
GFR, Estimated: >60 mL/min (ref >60)
Glucose, Bld: 97 mg/dL (ref 70–99)
Potassium: 3.9 mmol/L (ref 3.5–5.1)
Sodium: 138 mmol/L (ref 135–145)

## 2020-04-21 NOTE — Addendum Note (Signed)
Addended by: Ena Dawley on: 04/21/2020 09:48 AM   Modules accepted: Orders

## 2020-04-22 ENCOUNTER — Other Ambulatory Visit (HOSPITAL_BASED_OUTPATIENT_CLINIC_OR_DEPARTMENT_OTHER): Payer: Self-pay

## 2020-04-22 MED ORDER — CARESTART COVID-19 HOME TEST VI KIT
PACK | 0 refills | Status: DC
  Filled 2020-04-22 (×2): qty 4, 4d supply, fill #0

## 2020-05-04 ENCOUNTER — Other Ambulatory Visit (HOSPITAL_BASED_OUTPATIENT_CLINIC_OR_DEPARTMENT_OTHER): Payer: Self-pay

## 2020-05-07 ENCOUNTER — Other Ambulatory Visit (HOSPITAL_BASED_OUTPATIENT_CLINIC_OR_DEPARTMENT_OTHER): Payer: Self-pay

## 2020-05-07 ENCOUNTER — Other Ambulatory Visit: Payer: Self-pay | Admitting: Internal Medicine

## 2020-05-07 ENCOUNTER — Other Ambulatory Visit (HOSPITAL_COMMUNITY): Payer: Self-pay

## 2020-05-20 ENCOUNTER — Other Ambulatory Visit (HOSPITAL_BASED_OUTPATIENT_CLINIC_OR_DEPARTMENT_OTHER): Payer: Self-pay

## 2020-05-21 ENCOUNTER — Other Ambulatory Visit: Payer: Self-pay

## 2020-05-21 ENCOUNTER — Ambulatory Visit (HOSPITAL_BASED_OUTPATIENT_CLINIC_OR_DEPARTMENT_OTHER): Payer: 59 | Admitting: Family Medicine

## 2020-05-21 ENCOUNTER — Ambulatory Visit (HOSPITAL_BASED_OUTPATIENT_CLINIC_OR_DEPARTMENT_OTHER)
Admission: RE | Admit: 2020-05-21 | Discharge: 2020-05-21 | Disposition: A | Discharge status: Home / Self Care | Payer: 59 | Source: Ambulatory Visit | Attending: Family Medicine | Admitting: Family Medicine

## 2020-05-21 ENCOUNTER — Encounter (HOSPITAL_BASED_OUTPATIENT_CLINIC_OR_DEPARTMENT_OTHER): Payer: Self-pay | Admitting: Family Medicine

## 2020-05-21 ENCOUNTER — Other Ambulatory Visit (HOSPITAL_BASED_OUTPATIENT_CLINIC_OR_DEPARTMENT_OTHER): Payer: Self-pay

## 2020-05-21 VITALS — BP 140/90 | HR 92 | Ht 67.0 in | Wt 167.2 lb

## 2020-05-21 DIAGNOSIS — S93491A Sprain of other ligament of right ankle, initial encounter: Secondary | ICD-10-CM | POA: Diagnosis not present

## 2020-05-21 DIAGNOSIS — S99919A Unspecified injury of unspecified ankle, initial encounter: Secondary | ICD-10-CM | POA: Diagnosis not present

## 2020-05-21 DIAGNOSIS — S93409A Sprain of unspecified ligament of unspecified ankle, initial encounter: Secondary | ICD-10-CM

## 2020-05-21 DIAGNOSIS — M7989 Other specified soft tissue disorders: Secondary | ICD-10-CM | POA: Diagnosis not present

## 2020-05-21 HISTORY — DX: Sprain of unspecified ligament of unspecified ankle, initial encounter: S93.409A

## 2020-05-21 NOTE — Progress Notes (Signed)
    Procedures performed today:    None.  Independent interpretation of notes and tests performed by another provider:   Reviewed x-rays of the right ankle completed today.  There is a small calcification near tip of lateral malleolus, contours of this calcification suggest it is an old injury, likely related to prior ankle sprains.  Ankle mortise appears intact, no obvious fracture of tibia or fibula noted.  Brief History, Exam, Impression, and Recommendations:    Heather Wallace is a 40 year old female presenting for evaluation of right ankle injury.  Injury occurred yesterday.  She was stepping off the curb when leaving the gym and suffered an inversion injury.  She reports history of additional sprains to the same ankle in the past, however feels that current 1 is more painful than any of the prior.  She has had associated swelling, particularly over the lateral ankle.  Pain both over medial and lateral ankle, worse over lateral aspect of ankle.  Denies any pain into the foot, no pain near base of fifth metatarsal.  Has been able to ambulate in sneakers since the injury.  Brought boot to clinic today, however has not uses as of yet as this was brought from home by her husband.  Denies any associated numbness, has had mild tingling over dorsal aspect of foot.  BP 140/90   Pulse 92   Ht 5\' 7"  (1.702 m)   Wt 167 lb 3.2 oz (75.8 kg)   SpO2 98%   BMI 26.19 kg/m   Pleasant 40 year old female in no acute distress Right ankle with moderate swelling over lateral malleolus.  Tenderness to palpation through distal aspect of lateral malleolus, tenderness palpation over ATFL.  Mild tenderness to palpation over deltoid ligament medially.  Normal sensation distally, slightly reduced range of motion for dorsiflexion and plantar flexion.  Normal PT pulse and DP pulse.  Ankle sprain Suspect sprain of ATFL, less likely avulsion fracture of distal lateral malleolus Recommend conservative management with icing,  elevation, compression Encouraged to continue with mobilization as able, can consider use of boot as needed, particularly if on her feet a lot at work and needing the additional support/immobilization Cautioned against prolonged immobilization as this can lead to delayed rehab and recovery Can utilize OTC medications to help with pain control Provided handout covering ankle stretches and strengthening exercises for patient to progress through as tolerated Plan for follow-up in about 4 weeks or sooner as needed, particularly if any worsening of symptoms If not improving consider formal physical therapy, additional imaging    ___________________________________________ Heather Blassingame de [place], MD, ABFM, CAQSM [facility] [place]

## 2020-05-21 NOTE — Patient Instructions (Addendum)
Medication Instructions:  Your physician recommends that you continue on your current medications as directed. Please refer to the Current Medication list given to you today. --If you need a refill on any your medications before your next appointment, please call your pharmacy first. If no refills are authorized on file call the office.--  Follow-Up: Your next appointment:   Your physician recommends that you schedule a follow-up appointment in: 4 WEEKS with Dr. de [place]  Thanks for letting us be apart of your health journey!!  Primary Care and Sports Medicine   Dr. de [place] and Worthy Keeler, DNP, AGNP  We recommend signing up for the patient portal called "MyChart".  Sign up information is provided on this After Visit Summary.  MyChart is used to connect with patients for Virtual Visits (Telemedicine).  Patients are able to view lab/test results, encounter notes, upcoming appointments, etc.  Non-urgent messages can be sent to your provider as well.   To learn more about what you can do with MyChart, please visit --  [email or web address].        Ankle Sprain  An ankle sprain is a stretch or tear in a ligament in the ankle. Ligaments are tissues that connect bones to each other. The two most common types of ankle sprains are:  Inversion sprain. This happens when the foot turns inward and the ankle rolls outward. It affects the ligament on the outside of the foot (lateral ligament).  Eversion sprain. This happens when the foot turns outward and the ankle rolls inward. It affects the ligament on the inner side of the foot (medial ligament). What are the causes? This condition is often caused by accidentally rolling or twisting the ankle. What increases the risk? You are more likely to develop this condition if you play sports. What are the signs or symptoms? Symptoms of this condition include:  Pain in your ankle.  Swelling.  Bruising. This may develop right after you sprain  your ankle or 1-2 days later.  Trouble standing or walking, especially when you turn or change directions.   How is this diagnosed? This condition is diagnosed with:  A physical exam. During the exam, your health care provider will press on certain parts of your foot and ankle and try to move them in certain ways.  X-ray imaging. These may be taken to see how severe the sprain is and to check for broken bones. How is this treated? This condition may be treated with:  A brace or splint. This is used to keep the ankle from moving until it heals.  An elastic bandage. This is used to support the ankle.  Crutches.  Pain medicine.  Surgery. This may be needed if the sprain is severe.  Physical therapy. This may help to improve the range of motion in the ankle. Follow these instructions at home: If you have a brace or a splint:  Wear the brace or splint as told by your health care provider. Remove it only as told by your health care provider.  Loosen the brace or splint if your toes tingle, become numb, or turn cold and blue.  Keep the brace or splint clean.  If the brace or splint is not waterproof: ? Do not let it get wet. ? Cover it with a watertight covering when you take a bath or a shower. If you have an elastic bandage (dressing):  Remove it to shower or bathe.  Try not to move your ankle much, but wiggle your toes  from time to time. This helps to prevent swelling.  Adjust the dressing to make it more comfortable if it feels too tight.  Loosen the dressing if you have numbness or tingling in your foot, or if your foot becomes cold and blue. Managing pain, stiffness, and swelling  Take over-the-counter and prescription medicines only as told by your health care provider.  For 2-3 days, keep your ankle raised (elevated) above the level of your heart as much as possible.  If directed, put ice on the injured area: ? If you have a removable brace or splint, remove it as  told by your health care provider. ? Put ice in a plastic bag. ? Place a towel between your skin and the bag. ? Leave the ice on for 20 minutes, 2-3 times a day.   General instructions  Rest your ankle.  Do not use the injured limb to support your body weight until your health care provider says that you can. Use crutches as told by your health care provider.  Do not use any products that contain nicotine or tobacco, such as cigarettes, e-cigarettes, and chewing tobacco. If you need help quitting, ask your health care provider.  Keep all follow-up visits as told by your health care provider. This is important. Contact a health care provider if:  You have rapidly increasing bruising or swelling.  Your pain is not relieved with medicine. Get help right away if:  Your foot or toes become numb or blue.  You have severe pain that gets worse. Summary  An ankle sprain is a stretch or tear in a ligament in the ankle. Ligaments are tissues that connect bones to each other.  This condition is often caused by accidentally rolling or twisting the ankle.  Symptoms include pain, swelling, bruising, and trouble walking.  To relieve pain and swelling, put ice on the affected ankle, raise your ankle above the level of your heart, and use an elastic bandage.  Keep all follow-up visits as told by your health care provider. This is important. This information is not intended to replace advice given to you by your health care provider. Make sure you discuss any questions you have with your health care provider. Document Revised: 10/09/2017 Document Reviewed: 06/13/2017 Elsevier Patient Education  2021 [place].  Ankle Sprain, Phase I Rehab An ankle sprain is an injury to the ligaments of your ankle. Ankle sprains cause stiffness, loss of motion, and loss of strength. Ask your health care provider which exercises are safe for you. Do exercises exactly as told by your health care provider and  adjust them as directed. It is normal to feel mild stretching, pulling, tightness, or discomfort as you do these exercises. Stop right away if you feel sudden pain or your pain gets worse. Do not begin these exercises until told by your health care provider. Stretching and range-of-motion exercises These exercises warm up your muscles and joints and improve the movement and flexibility of your lower leg and ankle. These exercises also help to relieve pain and stiffness. Gastroc and soleus stretch This exercise is also called a calf stretch. It stretches the muscles in the back of the lower leg. These muscles are the gastrocnemius, or gastroc, and the soleus. 1. Sit on the floor with your left / right leg extended. 2. Loop a belt or towel around the ball of your left / right foot. The ball of your foot is on the walking surface, right under your toes. 3. Keep  your left / right ankle and foot relaxed and keep your knee straight while you use the belt or towel to pull your foot toward you. You should feel a gentle stretch behind your calf or knee in your gastroc muscle. 4. Hold this position for __________ seconds, then release to the starting position. 5. Repeat the exercise with your knee bent. You can put a pillow or a rolled bath towel under your knee to support it. You should feel a stretch deep in your calf in the soleus muscle or at your Achilles tendon. Repeat __________ times. Complete this exercise __________ times a day.   Ankle alphabet 1. Sit with your left / right leg supported at the lower leg. ? Do not rest your foot on anything. ? Make sure your foot has room to move freely. 2. Think of your left / right foot as a paintbrush. ? Move your foot to trace each letter of the alphabet in the air. Keep your hip and knee still while you trace. ? Make the letters as large as you can without feeling discomfort. 3. Trace every letter from A to Z. Repeat __________ times. Complete this exercise  __________ times a day.   Strengthening exercises These exercises build strength and endurance in your ankle and lower leg. Endurance is the ability to use your muscles for a long time, even after they get tired. Ankle dorsiflexion 1. Secure a rubber exercise band or tube to an object, such as a table leg, that will stay still when the band is pulled. Secure the other end around your left / right foot. 2. Sit on the floor facing the object, with your left / right leg extended. The band or tube should be slightly tense when your foot is relaxed. 3. Slowly bring your foot toward you, bringing the top of your foot toward your shin (dorsiflexion), and pulling the band tighter. 4. Hold this position for __________ seconds. 5. Slowly return your foot to the starting position. Repeat __________ times. Complete this exercise __________ times a day.   Ankle plantar flexion 1. Sit on the floor with your left / right leg extended. 2. Loop a rubber exercise tube or band around the ball of your left / right foot. The ball of your foot is on the walking surface, right under your toes. ? Hold the ends of the band or tube in your hands. ? The band or tube should be slightly tense when your foot is relaxed. 3. Slowly point your foot and toes downward to tilt the top of your foot away from your shin (plantar flexion). 4. Hold this position for __________ seconds. 5. Slowly return your foot to the starting position. Repeat __________ times. Complete this exercise __________ times a day.   Ankle eversion 1. Sit on the floor with your legs straight out in front of you. 2. Loop a rubber exercise band or tube around the ball of your left / right foot. The ball of your foot is on the walking surface, right under your toes. ? Hold the ends of the band in your hands, or secure the band to a stable object. ? The band or tube should be slightly tense when your foot is relaxed. 3. Slowly push your foot outward, away from  your other leg (eversion). 4. Hold this position for __________ seconds. 5. Slowly return your foot to the starting position. Repeat __________ times. Complete this exercise __________ times a day. This information is not intended to replace  advice given to you by your health care provider. Make sure you discuss any questions you have with your health care provider. Document Revised: 05/08/2018 Document Reviewed: 10/30/2017 Elsevier Patient Education  2021 [place].  Ankle Sprain, Phase II Rehab An ankle sprain is an injury to tissue that connects bone to bone (a ligament) in the ankle. Ankle sprains usually cause stiffness, loss of motion, and loss of strength. Ask your health care provider which exercises are safe for you. Do exercises exactly as told by your health care provider and adjust them as directed. It is normal to feel mild stretching, pulling, tightness, or discomfort as you do these exercises. Stop right away if you feel sudden pain or your pain gets worse. Do not begin these exercises until told by your health care provider. Stretching and range-of-motion exercises These exercises warm up your muscles and joints and improve the movement and flexibility of your lower leg and ankle. These exercises also help to relieve pain and stiffness. Standing gastroc stretch This exercise is also called a standing calf (gastroc) stretch. 6. Stand with your hands against a wall. 7. Extend your left / right leg behind you, and bend your front knee slightly. Your heels should be on the floor. 8. Keeping your heels on the floor and your back knee straight, shift your weight toward the wall. You should feel a gentle stretch in the back of your lower leg (calf). 9. Hold this position for __________ seconds. Repeat __________ times. Complete this exercise __________ times a day.   Standing soleus stretch This exercise is also called a standing calf (soleus) stretch. 4. Stand with your hands  against a wall. 5. Extend your left / right leg behind you, and bend your front knee slightly. Both of your heels should be on the floor. 6. Keeping your heels on the floor, bend your back knee and shift your weight slightly over your back leg. You should feel a gentle stretch deep in your calf. 7. Hold this position for __________ seconds. Repeat __________ times. Complete this exercise __________ times a day. Strengthening exercises These exercises build strength and endurance in your lower leg. Endurance is the ability to use your muscles for a long time, even after they get tired. Heel walking This exercise is sometimes called dorsiflexion. 6. Walk on your heels for __________ seconds or ___________ ft. Keep your toes as high as possible. Repeat __________ times. Complete this exercise __________ times a day.   Balance exercises These exercises improve your balance and the reaction and control of your ankle to help improve stability. Multi-angle lunge 6. Stand with your feet together. 7. Take a step forward with your left / right leg, and shift your weight onto that leg. Your back heel will come off the floor, and your back toes will stay in place. 8. Push off your front leg to return your front foot to the starting position next to your other foot. 9. Repeat to the side, to the back, and any other directions as told by your health care provider. Repeat __________ times. Complete this exercise __________ times a day. Single leg stand If this exercise is too easy, you can try it with your eyes closed or while standing on a pillow. 6. Without shoes, stand near a railing or in a door frame. Hold on to the railing or door frame as needed. Let loose of the railing or door frame as you are able. 7. Stand on your left / right foot.  Keep your big toe down on the floor and try to keep your arch lifted. 8. Hold this position for __________ seconds. Repeat __________ times. Complete this exercise  __________ times a day. Ankle inversion and eversion This exercise is also called foot rotation with a balance board. This exercise uses a balance board to rotate the foot and ankle inward (inversion) and outward (eversion). Ask your health care provider where you can get a balance board or how you can make one. 1. Stand on a non-carpeted surface near a countertop or wall. 2. Step onto the balance board so your feet are hip width apart. 3. Keep your feet in place and keep your upper body and hips steady. 4. Using only your feet and ankles to move the board, do the following exercises as told by your health care provider: ? Tip the board side to side as far as you can, alternating between tipping to the left and tipping to the right. ? Tip the board so it silently taps the floor. Do not let the board forcefully hit the floor. ? From time to time, pause to hold a steady midway position, with neither the right nor the left sides touching the ground. ? Tip the board side to side so the board does not hit the floor at all. From time to time, pause to hold a steady midway position. Repeat __________ times. Complete this exercise __________ times a day.   Ankle plantar flexion and dorsiflexion This exercise is also called foot flexion with a balance board. This exercise uses a balance board to push the foot downward and away from the leg (plantar flexion) or upward and toward the leg (dorsiflexion). Ask your health care provider where you can get a balance board or how you can make one. 1. Stand on a non-carpeted surface near a countertop or wall. 2. Step onto the balance board so your feet are hip width apart. 3. Keep your feet in place and keep your upper body and hips steady. 4. Using only your feet and ankles to move the board, do one or both of the following exercises as told by your health care provider: ? Tip the board forward and backward so the board silently taps the floor. Do not let the board  forcefully hit the floor. ? From time to time, pause to hold a steady position midway between touching the floor in front and touching the floor in back. ? Tip the board forward and backward so the board does not hit the floor at all. From time to time, pause to hold a steady position in the middle. Repeat __________ times. Complete this exercise __________ times a day.   This information is not intended to replace advice given to you by your health care provider. Make sure you discuss any questions you have with your health care provider. Document Revised: 05/08/2018 Document Reviewed: 10/30/2017 Elsevier Patient Education  2021 [employer].

## 2020-05-21 NOTE — Assessment & Plan Note (Signed)
Suspect sprain of ATFL, less likely avulsion fracture of distal lateral malleolus Recommend conservative management with icing, elevation, compression Encouraged to continue with mobilization as able, can consider use of boot as needed, particularly if on her feet a lot at work and needing the additional support/immobilization Cautioned against prolonged immobilization as this can lead to delayed rehab and recovery Can utilize OTC medications to help with pain control Provided handout covering ankle stretches and strengthening exercises for patient to progress through as tolerated Plan for follow-up in about 4 weeks or sooner as needed, particularly if any worsening of symptoms If not improving consider formal physical therapy, additional imaging

## 2020-05-21 NOTE — Progress Notes (Signed)
Patient is scheduled to see Dr Tennis Must [place] today at 2:30 and requested to have imagine completed prior to appointment Informed patient that orders are in and she can go to imaging and have Xray completed at her earliest convenience.

## 2020-05-26 ENCOUNTER — Other Ambulatory Visit (HOSPITAL_BASED_OUTPATIENT_CLINIC_OR_DEPARTMENT_OTHER): Payer: Self-pay

## 2020-05-27 DIAGNOSIS — L821 Other seborrheic keratosis: Secondary | ICD-10-CM | POA: Diagnosis not present

## 2020-05-27 DIAGNOSIS — L918 Other hypertrophic disorders of the skin: Secondary | ICD-10-CM | POA: Diagnosis not present

## 2020-05-27 DIAGNOSIS — D2271 Melanocytic nevi of right lower limb, including hip: Secondary | ICD-10-CM | POA: Diagnosis not present

## 2020-05-27 DIAGNOSIS — D2272 Melanocytic nevi of left lower limb, including hip: Secondary | ICD-10-CM | POA: Diagnosis not present

## 2020-05-27 DIAGNOSIS — D225 Melanocytic nevi of trunk: Secondary | ICD-10-CM | POA: Diagnosis not present

## 2020-05-27 DIAGNOSIS — L718 Other rosacea: Secondary | ICD-10-CM | POA: Diagnosis not present

## 2020-05-27 DIAGNOSIS — D1801 Hemangioma of skin and subcutaneous tissue: Secondary | ICD-10-CM | POA: Diagnosis not present

## 2020-06-02 ENCOUNTER — Other Ambulatory Visit (HOSPITAL_BASED_OUTPATIENT_CLINIC_OR_DEPARTMENT_OTHER): Payer: Self-pay

## 2020-06-02 ENCOUNTER — Other Ambulatory Visit: Payer: Self-pay | Admitting: Internal Medicine

## 2020-06-02 MED ORDER — OLMESARTAN MEDOXOMIL-HCTZ 40-25 MG PO TABS
1.0000 | ORAL_TABLET | Freq: Every day | ORAL | 1 refills | Status: DC
  Filled 2020-06-02: qty 90, 90d supply, fill #0
  Filled 2020-09-14: qty 90, 90d supply, fill #1

## 2020-06-02 MED FILL — Norethindrone Ace & Ethinyl Estradiol-FE Tab 1 MG-20 MCG: ORAL | 84 days supply | Qty: 112 | Fill #0 | Status: AC

## 2020-06-03 ENCOUNTER — Other Ambulatory Visit (HOSPITAL_BASED_OUTPATIENT_CLINIC_OR_DEPARTMENT_OTHER): Payer: Self-pay

## 2020-06-04 ENCOUNTER — Other Ambulatory Visit (HOSPITAL_BASED_OUTPATIENT_CLINIC_OR_DEPARTMENT_OTHER): Payer: Self-pay

## 2020-06-08 ENCOUNTER — Other Ambulatory Visit (HOSPITAL_BASED_OUTPATIENT_CLINIC_OR_DEPARTMENT_OTHER): Payer: Self-pay

## 2020-06-17 ENCOUNTER — Encounter (HOSPITAL_BASED_OUTPATIENT_CLINIC_OR_DEPARTMENT_OTHER): Payer: Self-pay | Admitting: Family Medicine

## 2020-06-17 ENCOUNTER — Ambulatory Visit (INDEPENDENT_AMBULATORY_CARE_PROVIDER_SITE_OTHER): Payer: 59 | Admitting: Family Medicine

## 2020-06-17 ENCOUNTER — Other Ambulatory Visit: Payer: Self-pay

## 2020-06-17 DIAGNOSIS — S93491D Sprain of other ligament of right ankle, subsequent encounter: Secondary | ICD-10-CM

## 2020-06-17 NOTE — Assessment & Plan Note (Signed)
Can continue with activities as tolerated May use bracing as needed Encouraged to complete rehab exercises at home, has band available, discussed utilization of rehab as risk reduction for future ankle sprains Plan for follow-up as needed

## 2020-06-17 NOTE — Progress Notes (Signed)
    Procedures performed today:    None.  Independent interpretation of notes and tests performed by another provider:   None.  Brief History, Exam, Impression, and Recommendations:    Doing well overall, has noticed significant improvement in pain.  Still has intermittent swelling, particularly at end of day.  Has been able to gradually return to some activity, typically enjoys doing [employer].  Has been modifying some of her [employer] activities.  Very mild tenderness along PT FL, no tenderness over ATFL, CFL today.  Normal range of motion, very minimal swelling laterally.  Ankle sprain Can continue with activities as tolerated May use bracing as needed Encouraged to complete rehab exercises at home, has band available, discussed utilization of rehab as risk reduction for future ankle sprains Plan for follow-up as needed    ___________________________________________ Alaisa Moffitt de [place], MD, ABFM, CAQSM [facility] and [facility]

## 2020-06-19 ENCOUNTER — Other Ambulatory Visit (HOSPITAL_BASED_OUTPATIENT_CLINIC_OR_DEPARTMENT_OTHER): Payer: Self-pay

## 2020-06-19 MED ORDER — COVID-19 AT HOME ANTIGEN TEST VI KIT
PACK | 0 refills | Status: DC
  Filled 2020-06-19: qty 4, 8d supply, fill #0

## 2020-06-24 ENCOUNTER — Other Ambulatory Visit (HOSPITAL_BASED_OUTPATIENT_CLINIC_OR_DEPARTMENT_OTHER): Payer: Self-pay

## 2020-06-27 ENCOUNTER — Ambulatory Visit: Payer: 59

## 2020-07-08 ENCOUNTER — Other Ambulatory Visit (HOSPITAL_BASED_OUTPATIENT_CLINIC_OR_DEPARTMENT_OTHER): Payer: Self-pay

## 2020-07-09 ENCOUNTER — Other Ambulatory Visit (HOSPITAL_BASED_OUTPATIENT_CLINIC_OR_DEPARTMENT_OTHER): Payer: Self-pay

## 2020-07-28 ENCOUNTER — Other Ambulatory Visit (HOSPITAL_BASED_OUTPATIENT_CLINIC_OR_DEPARTMENT_OTHER): Payer: Self-pay

## 2020-08-18 ENCOUNTER — Other Ambulatory Visit (HOSPITAL_BASED_OUTPATIENT_CLINIC_OR_DEPARTMENT_OTHER): Payer: Self-pay

## 2020-08-21 ENCOUNTER — Other Ambulatory Visit (HOSPITAL_BASED_OUTPATIENT_CLINIC_OR_DEPARTMENT_OTHER): Payer: Self-pay

## 2020-08-21 MED ORDER — COVID-19 AT HOME ANTIGEN TEST VI KIT
PACK | 0 refills | Status: DC
  Filled 2020-08-21: qty 4, 8d supply, fill #0

## 2020-08-28 ENCOUNTER — Other Ambulatory Visit (HOSPITAL_BASED_OUTPATIENT_CLINIC_OR_DEPARTMENT_OTHER): Payer: Self-pay

## 2020-09-02 ENCOUNTER — Other Ambulatory Visit (HOSPITAL_BASED_OUTPATIENT_CLINIC_OR_DEPARTMENT_OTHER): Payer: Self-pay

## 2020-09-07 ENCOUNTER — Other Ambulatory Visit (HOSPITAL_BASED_OUTPATIENT_CLINIC_OR_DEPARTMENT_OTHER): Payer: Self-pay

## 2020-09-08 ENCOUNTER — Other Ambulatory Visit (HOSPITAL_BASED_OUTPATIENT_CLINIC_OR_DEPARTMENT_OTHER): Payer: Self-pay

## 2020-09-08 MED FILL — Norethindrone Ace & Ethinyl Estradiol-FE Tab 1 MG-20 MCG: ORAL | 84 days supply | Qty: 112 | Fill #1 | Status: AC

## 2020-09-09 ENCOUNTER — Other Ambulatory Visit (HOSPITAL_BASED_OUTPATIENT_CLINIC_OR_DEPARTMENT_OTHER): Payer: Self-pay

## 2020-09-14 ENCOUNTER — Other Ambulatory Visit (HOSPITAL_BASED_OUTPATIENT_CLINIC_OR_DEPARTMENT_OTHER): Payer: Self-pay

## 2020-09-28 ENCOUNTER — Other Ambulatory Visit (HOSPITAL_BASED_OUTPATIENT_CLINIC_OR_DEPARTMENT_OTHER): Payer: Self-pay

## 2020-09-30 ENCOUNTER — Other Ambulatory Visit (HOSPITAL_BASED_OUTPATIENT_CLINIC_OR_DEPARTMENT_OTHER): Payer: Self-pay

## 2020-10-06 ENCOUNTER — Other Ambulatory Visit (HOSPITAL_BASED_OUTPATIENT_CLINIC_OR_DEPARTMENT_OTHER): Payer: Self-pay

## 2020-10-06 MED ORDER — COVID-19 AT HOME ANTIGEN TEST VI KIT
PACK | 0 refills | Status: DC
  Filled 2020-10-06: qty 2, 4d supply, fill #0

## 2020-10-12 ENCOUNTER — Other Ambulatory Visit (HOSPITAL_BASED_OUTPATIENT_CLINIC_OR_DEPARTMENT_OTHER): Payer: Self-pay

## 2020-10-14 ENCOUNTER — Other Ambulatory Visit (HOSPITAL_BASED_OUTPATIENT_CLINIC_OR_DEPARTMENT_OTHER): Payer: Self-pay

## 2020-10-15 ENCOUNTER — Other Ambulatory Visit: Payer: Self-pay

## 2020-10-16 ENCOUNTER — Other Ambulatory Visit (HOSPITAL_BASED_OUTPATIENT_CLINIC_OR_DEPARTMENT_OTHER): Payer: Self-pay

## 2020-10-23 ENCOUNTER — Other Ambulatory Visit (HOSPITAL_BASED_OUTPATIENT_CLINIC_OR_DEPARTMENT_OTHER): Payer: Self-pay

## 2020-10-26 ENCOUNTER — Other Ambulatory Visit (HOSPITAL_BASED_OUTPATIENT_CLINIC_OR_DEPARTMENT_OTHER): Payer: Self-pay

## 2020-10-26 MED ORDER — INFLUENZA VAC SPLIT QUAD 0.5 ML IM SUSY
PREFILLED_SYRINGE | INTRAMUSCULAR | 0 refills | Status: DC
  Filled 2020-10-26: qty 0.3, 1d supply, fill #0
  Filled 2020-10-30: qty 0.5, 1d supply, fill #0

## 2020-10-30 ENCOUNTER — Other Ambulatory Visit (HOSPITAL_BASED_OUTPATIENT_CLINIC_OR_DEPARTMENT_OTHER): Payer: Self-pay

## 2020-11-09 ENCOUNTER — Other Ambulatory Visit (HOSPITAL_BASED_OUTPATIENT_CLINIC_OR_DEPARTMENT_OTHER): Payer: Self-pay

## 2020-11-13 ENCOUNTER — Other Ambulatory Visit (HOSPITAL_BASED_OUTPATIENT_CLINIC_OR_DEPARTMENT_OTHER): Payer: Self-pay

## 2020-11-17 ENCOUNTER — Other Ambulatory Visit (HOSPITAL_BASED_OUTPATIENT_CLINIC_OR_DEPARTMENT_OTHER): Payer: Self-pay

## 2020-11-18 ENCOUNTER — Other Ambulatory Visit (HOSPITAL_BASED_OUTPATIENT_CLINIC_OR_DEPARTMENT_OTHER): Payer: Self-pay

## 2020-12-04 ENCOUNTER — Other Ambulatory Visit (HOSPITAL_BASED_OUTPATIENT_CLINIC_OR_DEPARTMENT_OTHER): Payer: Self-pay

## 2020-12-07 ENCOUNTER — Other Ambulatory Visit (HOSPITAL_BASED_OUTPATIENT_CLINIC_OR_DEPARTMENT_OTHER): Payer: Self-pay

## 2020-12-07 MED FILL — Norethindrone Ace & Ethinyl Estradiol-FE Tab 1 MG-20 MCG: ORAL | 84 days supply | Qty: 112 | Fill #2 | Status: AC

## 2020-12-09 ENCOUNTER — Other Ambulatory Visit (HOSPITAL_BASED_OUTPATIENT_CLINIC_OR_DEPARTMENT_OTHER): Payer: Self-pay

## 2020-12-17 ENCOUNTER — Other Ambulatory Visit (HOSPITAL_BASED_OUTPATIENT_CLINIC_OR_DEPARTMENT_OTHER): Payer: Self-pay

## 2020-12-17 MED ORDER — COVID-19 AT HOME ANTIGEN TEST VI KIT
PACK | 0 refills | Status: DC
  Filled 2020-12-17: qty 2, 4d supply, fill #0

## 2020-12-21 ENCOUNTER — Other Ambulatory Visit (HOSPITAL_BASED_OUTPATIENT_CLINIC_OR_DEPARTMENT_OTHER): Payer: Self-pay

## 2020-12-23 ENCOUNTER — Other Ambulatory Visit (HOSPITAL_BASED_OUTPATIENT_CLINIC_OR_DEPARTMENT_OTHER): Payer: Self-pay

## 2021-01-01 ENCOUNTER — Other Ambulatory Visit (HOSPITAL_BASED_OUTPATIENT_CLINIC_OR_DEPARTMENT_OTHER): Payer: Self-pay

## 2021-01-05 ENCOUNTER — Other Ambulatory Visit (HOSPITAL_BASED_OUTPATIENT_CLINIC_OR_DEPARTMENT_OTHER): Payer: Self-pay

## 2021-01-06 ENCOUNTER — Other Ambulatory Visit (HOSPITAL_BASED_OUTPATIENT_CLINIC_OR_DEPARTMENT_OTHER): Payer: Self-pay

## 2021-01-11 ENCOUNTER — Other Ambulatory Visit (HOSPITAL_BASED_OUTPATIENT_CLINIC_OR_DEPARTMENT_OTHER): Payer: Self-pay

## 2021-01-12 ENCOUNTER — Other Ambulatory Visit (HOSPITAL_BASED_OUTPATIENT_CLINIC_OR_DEPARTMENT_OTHER): Payer: Self-pay

## 2021-01-12 ENCOUNTER — Other Ambulatory Visit: Payer: Self-pay | Admitting: Internal Medicine

## 2021-01-12 MED ORDER — OLMESARTAN MEDOXOMIL-HCTZ 40-25 MG PO TABS
1.0000 | ORAL_TABLET | Freq: Every day | ORAL | 0 refills | Status: DC
  Filled 2021-01-12 – 2021-01-20 (×2): qty 30, 30d supply, fill #0

## 2021-01-12 NOTE — Telephone Encounter (Signed)
Last appt 03/2020.  Was to follow up in 6 months. Did not. She has no future appts. I have left detailed message on answering machine to call and make appt for labs BP check.

## 2021-01-20 ENCOUNTER — Other Ambulatory Visit (HOSPITAL_COMMUNITY): Payer: Self-pay

## 2021-01-20 ENCOUNTER — Other Ambulatory Visit (HOSPITAL_BASED_OUTPATIENT_CLINIC_OR_DEPARTMENT_OTHER): Payer: Self-pay

## 2021-01-21 ENCOUNTER — Other Ambulatory Visit (HOSPITAL_BASED_OUTPATIENT_CLINIC_OR_DEPARTMENT_OTHER): Payer: Self-pay

## 2021-02-03 ENCOUNTER — Other Ambulatory Visit (HOSPITAL_BASED_OUTPATIENT_CLINIC_OR_DEPARTMENT_OTHER): Payer: Self-pay

## 2021-02-03 MED ORDER — COVID-19 AT HOME ANTIGEN TEST VI KIT
PACK | 0 refills | Status: DC
  Filled 2021-02-03: qty 2, 4d supply, fill #0

## 2021-02-18 ENCOUNTER — Other Ambulatory Visit (HOSPITAL_BASED_OUTPATIENT_CLINIC_OR_DEPARTMENT_OTHER): Payer: Self-pay

## 2021-02-26 ENCOUNTER — Other Ambulatory Visit (HOSPITAL_BASED_OUTPATIENT_CLINIC_OR_DEPARTMENT_OTHER): Payer: Self-pay

## 2021-03-12 ENCOUNTER — Encounter (HOSPITAL_BASED_OUTPATIENT_CLINIC_OR_DEPARTMENT_OTHER): Payer: Self-pay

## 2021-03-18 ENCOUNTER — Other Ambulatory Visit (HOSPITAL_BASED_OUTPATIENT_CLINIC_OR_DEPARTMENT_OTHER): Payer: Self-pay

## 2021-03-18 MED FILL — Norethindrone Ace & Ethinyl Estradiol-FE Tab 1 MG-20 MCG: ORAL | 84 days supply | Qty: 112 | Fill #3 | Status: AC

## 2021-03-25 ENCOUNTER — Other Ambulatory Visit (HOSPITAL_BASED_OUTPATIENT_CLINIC_OR_DEPARTMENT_OTHER): Payer: Self-pay

## 2021-04-21 ENCOUNTER — Other Ambulatory Visit (HOSPITAL_BASED_OUTPATIENT_CLINIC_OR_DEPARTMENT_OTHER): Payer: Self-pay

## 2021-04-21 MED ORDER — COVID-19 AT HOME ANTIGEN TEST VI KIT
PACK | 0 refills | Status: DC
  Filled 2021-04-21: qty 4, 8d supply, fill #0

## 2021-04-23 ENCOUNTER — Other Ambulatory Visit (HOSPITAL_BASED_OUTPATIENT_CLINIC_OR_DEPARTMENT_OTHER): Payer: Self-pay

## 2021-04-27 ENCOUNTER — Other Ambulatory Visit (HOSPITAL_BASED_OUTPATIENT_CLINIC_OR_DEPARTMENT_OTHER): Payer: Self-pay

## 2021-04-27 DIAGNOSIS — Z1389 Encounter for screening for other disorder: Secondary | ICD-10-CM | POA: Diagnosis not present

## 2021-04-27 DIAGNOSIS — Z01419 Encounter for gynecological examination (general) (routine) without abnormal findings: Secondary | ICD-10-CM | POA: Diagnosis not present

## 2021-04-27 DIAGNOSIS — Z6828 Body mass index (BMI) 28.0-28.9, adult: Secondary | ICD-10-CM | POA: Diagnosis not present

## 2021-04-27 DIAGNOSIS — Z3041 Encounter for surveillance of contraceptive pills: Secondary | ICD-10-CM | POA: Diagnosis not present

## 2021-04-27 MED ORDER — NORETHIN ACE-ETH ESTRAD-FE 1-20 MG-MCG PO TABS
ORAL_TABLET | ORAL | 4 refills | Status: DC
  Filled 2021-04-27 – 2021-06-30 (×2): qty 112, 84d supply, fill #0
  Filled 2021-09-17: qty 112, 84d supply, fill #1
  Filled 2021-12-12: qty 112, 84d supply, fill #2
  Filled 2022-03-11: qty 112, 84d supply, fill #3

## 2021-05-04 ENCOUNTER — Encounter (HOSPITAL_BASED_OUTPATIENT_CLINIC_OR_DEPARTMENT_OTHER): Payer: Self-pay | Admitting: Nurse Practitioner

## 2021-05-04 ENCOUNTER — Other Ambulatory Visit (HOSPITAL_BASED_OUTPATIENT_CLINIC_OR_DEPARTMENT_OTHER): Payer: Self-pay

## 2021-05-04 ENCOUNTER — Ambulatory Visit (HOSPITAL_BASED_OUTPATIENT_CLINIC_OR_DEPARTMENT_OTHER): Payer: 59 | Admitting: Nurse Practitioner

## 2021-05-04 VITALS — BP 142/90 | HR 81 | Temp 98.1°F | Ht 67.0 in | Wt 179.8 lb

## 2021-05-04 DIAGNOSIS — H66001 Acute suppurative otitis media without spontaneous rupture of ear drum, right ear: Secondary | ICD-10-CM

## 2021-05-04 DIAGNOSIS — R062 Wheezing: Secondary | ICD-10-CM | POA: Diagnosis not present

## 2021-05-04 DIAGNOSIS — I10 Essential (primary) hypertension: Secondary | ICD-10-CM

## 2021-05-04 MED ORDER — ALBUTEROL SULFATE HFA 108 (90 BASE) MCG/ACT IN AERS
1.0000 | INHALATION_SPRAY | RESPIRATORY_TRACT | 3 refills | Status: DC | PRN
  Filled 2021-05-04: qty 8.5, 25d supply, fill #0

## 2021-05-04 MED ORDER — AMOXICILLIN-POT CLAVULANATE 875-125 MG PO TABS
1.0000 | ORAL_TABLET | Freq: Two times a day (BID) | ORAL | 0 refills | Status: DC
  Filled 2021-05-04: qty 10, 5d supply, fill #0

## 2021-05-04 NOTE — Patient Instructions (Addendum)
It was a pleasure seeing you today. I hope your time spent with [place] was pleasant and helpful. Please let us know if there is anything we can do to improve the service you receive.  ? ?Continue to take zyrtec daily to help with allergy symptoms. You may want to consider alternating this with another OTC allergy medication if it does not seem to be helping as much.  ? ?Flonase may help with inflammation in the ears causing the pressure.  ? ?If you do not feel better by Thursday, please let me know.  ? ? ? ?Important Office Information ?Lab Results ?If labs were ordered, please note that you will see results through [facility] as soon as they come available from [facility].  ?It takes up to 5 business days for the results to be routed to me and for me to review them once all of the lab results have come through from [facility]. I will make recommendations based on your results and send these through [facility] or someone from the office will call you to discuss. If your labs are abnormal, we may contact you to schedule a visit to discuss the results and make recommendations.  ?If you have not heard from [place] within 5 business days or you have waited longer than a week and your lab results have not come through on [facility], please feel free to call the office or send a message through [facility] to follow-up on these labs.  ? ?Referrals ?If referrals were placed today, the office where the referral was sent will contact you either by phone or through [facility] to set up scheduling. Please note that it can take up to a week for the referral office to contact you. If you do not hear from them in a week, please contact the referral office directly to inquire about scheduling.  ? ?Condition Treated ?If your condition worsens or you begin to have new symptoms, please schedule a follow-up appointment for further evaluation. If you are not sure if an appointment is needed, you may call the office to leave a message for the nurse and someone will  contact you with recommendations.  ?If you have an urgent or life threatening emergency, please do not call the office, but seek emergency evaluation by calling 911 or going to the nearest emergency room for evaluation.  ? ?MyChart and Phone Calls ?Please do not use MyChart for urgent messages. It may take up to 3 business days for MyChart messages to be read by staff and if they are unable to handle the request, an additional 3 business days for them to be routed to me and for my response.  ?Messages sent to the provider through [place] do not come directly to the provider, please allow time for these messages to be routed and for me to respond.  ?We get a large volume of MyChart messages daily and these are responded to in the order received.  ? ?For urgent messages, please call the office at [phone] and speak with the front office staff or leave a message on the line of my assistant for guidance.  ?We are seeing patients from the hours of 8:00 am through 5:00 pm and calls directly to the nurse may not be answered immediately due to seeing patients, but your call will be returned as soon as possible.  ?Phone  messages received after 4:00 PM Monday through Thursday may not be returned until the following business day. Phone messages received after 11:00 AM on Friday  may not be returned until Monday.  ? ?After Hours ?We share on call hours with providers from other offices. If you have an urgent need after hours that cannot wait until the next business day, please contact the on call provider by calling the office number. A nurse will speak with you and contact the provider if needed for recommendations.  ?If you have an urgent or life threatening emergency after hours, please do not call the on call provider, but seek emergency evaluation by calling 911 or going to the nearest emergency room for evaluation.  ? ?Paperwork ?All paperwork requires a minimum of 5 days to complete and return to you or the  designated personnel. Please keep this in mind when bringing in forms or sending requests for paperwork completion to the office.  ?  ?

## 2021-05-06 ENCOUNTER — Encounter (HOSPITAL_BASED_OUTPATIENT_CLINIC_OR_DEPARTMENT_OTHER): Payer: Self-pay | Admitting: Nurse Practitioner

## 2021-05-06 ENCOUNTER — Other Ambulatory Visit (HOSPITAL_BASED_OUTPATIENT_CLINIC_OR_DEPARTMENT_OTHER): Payer: Self-pay | Admitting: Nurse Practitioner

## 2021-05-06 ENCOUNTER — Other Ambulatory Visit (HOSPITAL_BASED_OUTPATIENT_CLINIC_OR_DEPARTMENT_OTHER): Payer: Self-pay

## 2021-05-06 DIAGNOSIS — H66001 Acute suppurative otitis media without spontaneous rupture of ear drum, right ear: Secondary | ICD-10-CM

## 2021-05-06 MED ORDER — CEFDINIR 300 MG PO CAPS
300.0000 mg | ORAL_CAPSULE | Freq: Two times a day (BID) | ORAL | 0 refills | Status: DC
Start: 2021-05-06 — End: 2021-05-25
  Filled 2021-05-06: qty 10, 5d supply, fill #0

## 2021-05-06 MED ORDER — FLUCONAZOLE 150 MG PO TABS
150.0000 mg | ORAL_TABLET | Freq: Once | ORAL | 1 refills | Status: AC
  Filled 2021-05-06: qty 1, 1d supply, fill #0
  Filled 2021-06-08 – 2021-09-06 (×2): qty 1, 1d supply, fill #1

## 2021-05-10 DIAGNOSIS — H66001 Acute suppurative otitis media without spontaneous rupture of ear drum, right ear: Secondary | ICD-10-CM | POA: Insufficient documentation

## 2021-05-10 DIAGNOSIS — R062 Wheezing: Secondary | ICD-10-CM

## 2021-05-10 HISTORY — DX: Acute suppurative otitis media without spontaneous rupture of ear drum, right ear: H66.001

## 2021-05-10 HISTORY — DX: Wheezing: R06.2

## 2021-05-10 NOTE — Assessment & Plan Note (Signed)
BP elevated at office visit today.  ?Patient is ill today, therefore suspect this is driving up BP. Will continue to monitor.  ?No alarm sx present today.  ?Recommend monitoring BP at home and notify if readings remain >140/90.  ?

## 2021-05-10 NOTE — Progress Notes (Signed)
?Heather Render, DNP, AGNP-c ?Primary Care & Sports Medicine ?[place], [place] [address] ?[phone] ? ?New patient visit ? ? ?Patient: Heather Wallace   DOB: [birth year]   41 y.o. Female  MRN: [id number] ?Visit Date: 05/04/2021 ? ?Patient Care Team: ?Heather Wallace, Heather Pesa, NP as PCP - General (Nurse Practitioner) ? ?Today's healthcare provider: Orma Render, NP  ? ?Chief Complaint  ?Patient presents with  ? URI  ? Otitis Media  ? ?Subjective  ?  ?Heather Wallace is a 41 y.o. female who presents today as a new patient to establish care.  ?  ?Patient endorses the following concerns presently: ?Symptoms of congestion, ear pressure/pain, sinus pressure/pain, cough, and fatigue that has been present for several days. She has not had relief with OTC medications. She has not had any known fever, but has had chills present. She has had her flu vaccine this year. She endorses exacerbation of allergy symptoms prior to worsening symptoms. ? ?History reviewed and reveals the following: ?Past Medical History:  ?Diagnosis Date  ? Kidney stone 08/07  ? Rectal fissure   ? ?History reviewed. No pertinent surgical history. ?Family Status  ?Relation Name Status  ? Mother  Alive  ? Father  Alive  ? Brother  Alive  ? ?Family History  ?Problem Relation Age of Onset  ? Hypertension Father   ? ?Social History  ? ?Socioeconomic History  ? Marital status: Married  ?  Spouse name: Not on file  ? Number of children: Not on file  ? Years of education: Not on file  ? Highest education level: Not on file  ?Occupational History  ? Not on file  ?Tobacco Use  ? Smoking status: Never  ? Smokeless tobacco: Never  ?Substance and Sexual Activity  ? Alcohol use: No  ?  Comment: occasional   ? Drug use: No  ? Sexual activity: Not on file  ?Other Topics Concern  ? Not on file  ?Social History Narrative  ? Not on file  ? ?Social Determinants of Health  ? ?Financial Resource Strain: Not on file  ?Food Insecurity: Not on  file  ?Transportation Needs: Not on file  ?Physical Activity: Not on file  ?Stress: Not on file  ?Social Connections: Not on file  ? ?Outpatient Medications Prior to Visit  ?Medication Sig  ? fluconazole (DIFLUCAN) 150 MG tablet fluconazole 150 mg tablet  ? norethindrone-ethinyl estradiol-FE (BLISOVI FE 1/20) 1-20 MG-MCG tablet TAKE 1 TABLET BY MOUTH ONCE DAILY FOR 84 DAYS  ? olmesartan-hydrochlorothiazide (BENICAR HCT) 40-25 MG tablet TAKE 1 TABLET BY MOUTH DAILY.  ? [DISCONTINUED] amoxicillin-clavulanate (AUGMENTIN) 875-125 MG tablet amoxicillin 875 mg-potassium clavulanate 125 mg tablet  ? [DISCONTINUED] COVID-19 At Home Antigen Test KIT Use as directed  ? [DISCONTINUED] COVID-19 At Home Antigen Test KIT Use as directed  ? [DISCONTINUED] influenza vac split quadrivalent PF (FLUARIX) 0.5 ML injection Inject into the muscle.  ? ?No facility-administered medications prior to visit.  ? ?No Active Allergies ?Immunization History  ?Administered Date(s) Administered  ? Influenza Split 10/18/2012  ? Influenza, Seasonal, Injecte, Preservative Fre 10/20/2014  ? Influenza,inj,Quad PF,6+ Mos 09/30/2011, 10/31/2013, 09/28/2016, 09/22/2017  ? Influenza-Unspecified 10/21/2019  ? Moderna Sars-Covid-2 Vaccination 02/20/2019  ? Tdap 08/09/2008, 09/11/2019  ? ? ?Review of Systems ?All review of systems negative except what is listed in the HPI ? ? Objective  ?  ?BP (!) 142/90   Pulse 81   Temp 98.1 ?F (36.7 ?C)  Ht '5\' 7"'  (1.702 m)   Wt 179 lb 12.8 oz (81.6 kg)   SpO2 99%   BMI 28.16 kg/m?  ?Physical Exam ?Vitals and nursing note reviewed.  ?Constitutional:   ?   Appearance: Normal appearance. She is ill-appearing.  ?HENT:  ?   Head: Normocephalic.  ?   Right Ear: Hearing normal. A middle ear effusion is present. Tympanic membrane is bulging.  ?   Left Ear: Hearing normal. A middle ear effusion is present. Tympanic membrane is bulging.  ?   Nose: Mucosal edema, congestion and rhinorrhea present. Rhinorrhea is clear and  purulent.  ?   Right Sinus: Maxillary sinus tenderness present. No frontal sinus tenderness.  ?   Left Sinus: Maxillary sinus tenderness present. No frontal sinus tenderness.  ?   Mouth/Throat:  ?   Mouth: Mucous membranes are moist.  ?   Pharynx: Posterior oropharyngeal erythema present. No oropharyngeal exudate or uvula swelling.  ?   Tonsils: No tonsillar exudate.  ?Neck:  ?   Vascular: No carotid bruit.  ?Cardiovascular:  ?   Rate and Rhythm: Normal rate and regular rhythm.  ?   Pulses: Normal pulses.  ?   Heart sounds: Normal heart sounds.  ?Pulmonary:  ?   Effort: Pulmonary effort is normal.  ?   Breath sounds: Wheezing present. No rhonchi.  ?Chest:  ?   Chest wall: No tenderness.  ?Musculoskeletal:  ?   Cervical back: Normal range of motion.  ?   Right lower leg: No edema.  ?   Left lower leg: No edema.  ?Lymphadenopathy:  ?   Cervical: Cervical adenopathy present.  ?Skin: ?   General: Skin is warm and dry.  ?   Capillary Refill: Capillary refill takes less than 2 seconds.  ?Neurological:  ?   General: No focal deficit present.  ?   Mental Status: She is alert and oriented to person, place, and time.  ?Psychiatric:     ?   Mood and Affect: Mood normal.     ?   Behavior: Behavior normal.     ?   Thought Content: Thought content normal.     ?   Judgment: Judgment normal.  ? ? ?No results found for any visits on 05/04/21. ? Assessment & Plan   ?  ? ?Problem List Items Addressed This Visit   ? ? Essential hypertension  ?  BP elevated at office visit today.  ?Patient is ill today, therefore suspect this is driving up BP. Will continue to monitor.  ?No alarm sx present today.  ?Recommend monitoring BP at home and notify if readings remain >140/90.  ?  ?  ? Wheezing  ? Relevant Medications  ? albuterol (VENTOLIN HFA) 108 (90 Base) MCG/ACT inhaler  ? Non-recurrent acute suppurative otitis media of right ear without spontaneous rupture of tympanic membrane - Primary  ?  Symptoms and presentation consistent with otitis  media. Suspect sinusitis infection superimposed related to recent allergy exacerbation.  ?Will send antibiotics for patient and recommend restart of allergy medication (oral) and fluticasone (nasal) for symptoms. Will also send albuterol for wheezing secondary to infection.  ?Patient to f/u if sx worsen or fail to improve.  ? ?Addendum: patient not experiencing relief with oral antibiotics. Will switch medication for patient.  ?  ?  ? Relevant Medications  ? fluconazole (DIFLUCAN) 150 MG tablet  ? ? ? ?Return if symptoms worsen or fail to improve.  ?  ? ? ?Heather Render,  NP, DNP, AGNP-C ?Primary Care & Sports Medicine at [facility] ?[facility]  ? ?

## 2021-05-10 NOTE — Assessment & Plan Note (Signed)
Symptoms and presentation consistent with otitis media. Suspect sinusitis infection superimposed related to recent allergy exacerbation.  ?Will send antibiotics for patient and recommend restart of allergy medication (oral) and fluticasone (nasal) for symptoms. Will also send albuterol for wheezing secondary to infection.  ?Patient to f/u if sx worsen or fail to improve.  ? ?Addendum: patient not experiencing relief with oral antibiotics. Will switch medication for patient.  ?

## 2021-05-12 ENCOUNTER — Other Ambulatory Visit (HOSPITAL_BASED_OUTPATIENT_CLINIC_OR_DEPARTMENT_OTHER): Payer: Self-pay

## 2021-05-18 ENCOUNTER — Ambulatory Visit (HOSPITAL_BASED_OUTPATIENT_CLINIC_OR_DEPARTMENT_OTHER): Payer: 59 | Admitting: Nurse Practitioner

## 2021-05-18 ENCOUNTER — Encounter (HOSPITAL_BASED_OUTPATIENT_CLINIC_OR_DEPARTMENT_OTHER): Payer: 59 | Admitting: Nurse Practitioner

## 2021-05-25 ENCOUNTER — Encounter (HOSPITAL_BASED_OUTPATIENT_CLINIC_OR_DEPARTMENT_OTHER): Payer: Self-pay | Admitting: Nurse Practitioner

## 2021-05-25 ENCOUNTER — Ambulatory Visit (INDEPENDENT_AMBULATORY_CARE_PROVIDER_SITE_OTHER): Payer: 59 | Admitting: Nurse Practitioner

## 2021-05-25 VITALS — BP 128/88 | HR 61 | Ht 67.0 in | Wt 174.0 lb

## 2021-05-25 DIAGNOSIS — Z0000 Encounter for general adult medical examination without abnormal findings: Secondary | ICD-10-CM

## 2021-05-25 DIAGNOSIS — R61 Generalized hyperhidrosis: Secondary | ICD-10-CM

## 2021-05-25 DIAGNOSIS — I10 Essential (primary) hypertension: Secondary | ICD-10-CM

## 2021-05-25 DIAGNOSIS — R635 Abnormal weight gain: Secondary | ICD-10-CM

## 2021-05-25 DIAGNOSIS — N951 Menopausal and female climacteric states: Secondary | ICD-10-CM | POA: Insufficient documentation

## 2021-05-25 NOTE — Assessment & Plan Note (Signed)
Intermittent night sweats of unknown etiology. No additional associated symptoms to indicate concerning finding. Consider possible Jonuel Butterfield menopause given patients age- she is on continuous OCP, which does not allow for evaluation of cycles. Will obtain labs today for evaluation and make recommendations to plan of care as needed based on findings.  ?

## 2021-05-25 NOTE — Assessment & Plan Note (Signed)
Chronic. BP well controlled today. No alarm sx present. Will obtain labs for evaluation. Recommend close monitoring and continuation of current regimen.  ?

## 2021-05-25 NOTE — Progress Notes (Signed)
? ?BP 128/88   Pulse 61   Ht '5\' 7"'$  (1.702 m)   Wt 174 lb (78.9 kg)   LMP  (LMP Unknown)   SpO2 96%   BMI 27.25 kg/m?   ? ?Subjective:  ? ? Patient ID: Heather Wallace, female    DOB: April 08, 1980, 41 y.o.   MRN: [id number] ? ?HPI: ?Heather Wallace is a 41 y.o. female presenting on 05/25/2021 for comprehensive medical examination.  ? ?Current medical concerns include: weight gain, night sweats.  ? ?She reports regular vision exams q1-5y: yes ?She reports regular dental exams q 49m yes ?Her diet consists of:  healthy options  ?She endorses exercise and/or activity of: 4-5 days a week weight training and cardio at [employer]?She works in:  PAdministrator, sportsat [employer]? ?She endorses ETOH use ( 1 drink or less a week ) ?She denies nictoine use  ?She denies illegal substance use  ? ?She reports continuous birth control with no cycles. She is experiencing some night sweats and weight gain, which she is not sure if these are hormone related or something else.  ? ?She is currently sexually active with one partner ?She denies concerns today about STI ? ?She denies concerns about skin changes today  ?She denies concerns about bowel changes today  ?She denies concerns about bladder changes today  ? ?Most Recent Depression Screen:  ? ?  05/10/2021  ? 12:48 PM 03/09/2020  ?  2:51 PM 03/26/2018  ? 10:56 AM 02/14/2017  ? 10:00 AM  ?Depression screen PHQ 2/9  ?Decreased Interest 0 0 0 0  ?Down, Depressed, Hopeless 0 0 0 0  ?PHQ - 2 Score 0 0 0 0  ? ?Most Recent Anxiety Screen:  ?   ? View : No data to display.  ?  ?  ?  ? ?Most Recent Fall Screen: ? ?  05/10/2021  ? 12:48 PM 03/09/2020  ?  2:51 PM  ?Fall Risk   ?Falls in the past year? 0 0  ?Number falls in past yr: 0 0  ?Injury with Fall? 0 0  ?Risk for fall due to : No Fall Risks   ?Follow up Falls evaluation completed Falls evaluation completed  ? ? ?All ROS negative except what is listed above and in the HPI.  ? ?Past medical history, surgical history, medications, allergies, family history and  social history reviewed with patient today and changes made to appropriate areas of the chart.  ?Past Medical History:  ?Past Medical History:  ?Diagnosis Date  ? Ankle sprain 05/21/2020  ? History of premature delivery 03/26/2018  ? Kidney stone 08/07  ? Kidney stone 01/31/2005  ? Non-recurrent acute suppurative otitis media of right ear without spontaneous rupture of tympanic membrane 05/10/2021  ? Rectal fissure   ? Wheezing 05/10/2021  ? ?Medications:  ?Current Outpatient Medications on File Prior to Visit  ?Medication Sig  ? fluconazole (DIFLUCAN) 150 MG tablet fluconazole 150 mg tablet  ? norethindrone-ethinyl estradiol-FE (BLISOVI FE 1/20) 1-20 MG-MCG tablet TAKE 1 TABLET BY MOUTH ONCE DAILY FOR 84 DAYS  ? olmesartan-hydrochlorothiazide (BENICAR HCT) 40-25 MG tablet TAKE 1 TABLET BY MOUTH DAILY.  ? ?No current facility-administered medications on file prior to visit.  ? ?Surgical History:  ?History reviewed. No pertinent surgical history. ?Allergies:  ?No Active Allergies ?Social History:  ?Social History  ? ?Socioeconomic History  ? Marital status: Married  ?  Spouse name: Not on file  ? Number of children: Not on file  ?  Years of education: Not on file  ? Highest education level: Not on file  ?Occupational History  ? Not on file  ?Tobacco Use  ? Smoking status: Never  ? Smokeless tobacco: Never  ?Substance and Sexual Activity  ? Alcohol use: No  ?  Comment: occasional   ? Drug use: No  ? Sexual activity: Not on file  ?Other Topics Concern  ? Not on file  ?Social History Narrative  ? Not on file  ? ?Social Determinants of Health  ? ?Financial Resource Strain: Not on file  ?Food Insecurity: Not on file  ?Transportation Needs: Not on file  ?Physical Activity: Not on file  ?Stress: Not on file  ?Social Connections: Not on file  ?Intimate Partner Violence: Not on file  ? ?Social History  ? ?Tobacco Use  ?Smoking Status Never  ?Smokeless Tobacco Never  ? ?Social History  ? ?Substance and Sexual Activity  ?Alcohol Use  No  ? Comment: occasional   ? ?Family History:  ?Family History  ?Problem Relation Age of Onset  ? Hypertension Father   ? ? ?   ?Objective:  ?  ?BP 128/88   Pulse 61   Ht '5\' 7"'$  (1.702 m)   Wt 174 lb (78.9 kg)   LMP  (LMP Unknown)   SpO2 96%   BMI 27.25 kg/m?   ?Wt Readings from Last 3 Encounters:  ?05/25/21 174 lb (78.9 kg)  ?05/04/21 179 lb 12.8 oz (81.6 kg)  ?05/21/20 167 lb 3.2 oz (75.8 kg)  ?  ?Physical Exam ?Vitals and nursing note reviewed.  ?Constitutional:   ?   General: She is not in acute distress. ?   Appearance: Normal appearance.  ?HENT:  ?   Head: Normocephalic and atraumatic.  ?   Right Ear: Hearing, tympanic membrane, ear canal and external ear normal.  ?   Left Ear: Hearing, tympanic membrane, ear canal and external ear normal.  ?   Nose: Nose normal.  ?   Right Sinus: No maxillary sinus tenderness or frontal sinus tenderness.  ?   Left Sinus: No maxillary sinus tenderness or frontal sinus tenderness.  ?   Mouth/Throat:  ?   Lips: Pink.  ?   Mouth: Mucous membranes are moist.  ?   Pharynx: Oropharynx is clear.  ?Eyes:  ?   General: Lids are normal. Vision grossly intact.  ?   Extraocular Movements: Extraocular movements intact.  ?   Conjunctiva/sclera: Conjunctivae normal.  ?   Pupils: Pupils are equal, round, and reactive to light.  ?   Funduscopic exam: ?   Right eye: Red reflex present.     ?   Left eye: Red reflex present. ?   Visual Fields: Right eye visual fields normal and left eye visual fields normal.  ?Neck:  ?   Thyroid: No thyromegaly.  ?   Vascular: No carotid bruit.  ?Cardiovascular:  ?   Rate and Rhythm: Normal rate and regular rhythm.  ?   Chest Wall: PMI is not displaced.  ?   Pulses: Normal pulses.     ?     Dorsalis pedis pulses are 2+ on the right side and 2+ on the left side.  ?     Posterior tibial pulses are 2+ on the right side and 2+ on the left side.  ?   Heart sounds: Normal heart sounds. No murmur heard. ?Pulmonary:  ?   Effort: Pulmonary effort is normal. No  respiratory distress.  ?  Breath sounds: Normal breath sounds.  ?Abdominal:  ?   General: Abdomen is flat. Bowel sounds are normal. There is no distension.  ?   Palpations: Abdomen is soft. There is no hepatomegaly, splenomegaly or mass.  ?   Tenderness: There is no abdominal tenderness. There is no right CVA tenderness, left CVA tenderness, guarding or rebound.  ?Musculoskeletal:     ?   General: Normal range of motion.  ?   Cervical back: Full passive range of motion without pain, normal range of motion and neck supple. No tenderness.  ?   Right lower leg: No edema.  ?   Left lower leg: No edema.  ?Feet:  ?   Left foot:  ?   Toenail Condition: Left toenails are normal.  ?Lymphadenopathy:  ?   Cervical: No cervical adenopathy.  ?   Upper Body:  ?   Right upper body: No supraclavicular adenopathy.  ?   Left upper body: No supraclavicular adenopathy.  ?Skin: ?   General: Skin is warm and dry.  ?   Capillary Refill: Capillary refill takes less than 2 seconds.  ?   Nails: There is no clubbing.  ?Neurological:  ?   General: No focal deficit present.  ?   Mental Status: She is alert and oriented to person, place, and time.  ?   GCS: GCS eye subscore is 4. GCS verbal subscore is 5. GCS motor subscore is 6.  ?   Sensory: Sensation is intact.  ?   Motor: Motor function is intact.  ?   Coordination: Coordination is intact.  ?   Gait: Gait is intact.  ?   Deep Tendon Reflexes: Reflexes are normal and symmetric.  ?Psychiatric:     ?   Attention and Perception: Attention normal.     ?   Mood and Affect: Mood normal.     ?   Speech: Speech normal.     ?   Behavior: Behavior normal. Behavior is cooperative.     ?   Thought Content: Thought content normal.     ?   Cognition and Memory: Cognition and memory normal.     ?   Judgment: Judgment normal.  ? ? ?Results for orders placed or performed during the hospital encounter of 04/21/20  ?Basic metabolic panel  ?Result Value Ref Range  ? Sodium 138 135 - 145 mmol/L  ? Potassium 3.9  3.5 - 5.1 mmol/L  ? Chloride 102 98 - 111 mmol/L  ? CO2 28 22 - 32 mmol/L  ? Glucose, Bld 97 70 - 99 mg/dL  ? BUN 16 6 - 20 mg/dL  ? Creatinine, Ser 0.73 0.44 - 1.00 mg/dL  ? Calcium 9.3 8.9 - 10.3 mg/dL  ? GFR, Estimated >

## 2021-05-25 NOTE — Assessment & Plan Note (Signed)
Recent weight gain despite routine daily physical activity and dietary observance for healthy options. No clear etiology present at this time. No alarm symptoms present. In the setting of night sweats consider Heather Wallace menopausal changes vs thyroid etiology that could be contributing. Will monitor labs today and make changes to the plan of care as necessary based on findings.  ?

## 2021-05-25 NOTE — Patient Instructions (Signed)
It was a pleasure seeing you today. I hope your time spent with [place] was pleasant and helpful. Please let us know if there is anything we can do to improve the service you receive.  ? ?Today we discussed concerns with: ? ?Essential hypertension ? ?Laboratory tests ordered as part of a complete physical exam (CPE) ? ?Encounter for annual physical exam ? ?Unexplained night sweats ? ?Weight gain ? ? ?Let's check the labs and see how things are looking and we can see if we can find out what could be causing some of your symptoms.  ? ?The following orders have been placed for you today: ? ?Orders Placed This Encounter  ?Procedures  ? CBC with Differential/Platelet  ?  Order Specific Question:   Release to patient  ?  Answer:   Immediate  ? Comprehensive metabolic panel  ?  Order Specific Question:   Has the patient fasted?  ?  Answer:   Yes  ?  Order Specific Question:   Release to patient  ?  Answer:   Immediate  ? Lipid panel  ?  Order Specific Question:   Has the patient fasted?  ?  Answer:   Yes  ?  Order Specific Question:   Release to patient  ?  Answer:   Immediate  ? Hemoglobin A1c  ?  Order Specific Question:   Release to patient  ?  Answer:   Immediate  ? TSH  ?  Order Specific Question:   Release to patient  ?  Answer:   Immediate  ? T4, free  ? VITAMIN D 25 Hydroxy (Vit-D Deficiency, Fractures)  ?  Order Specific Question:   Release to patient  ?  Answer:   Immediate  ? Estrogens, total  ?  Order Specific Question:   Release to patient  ?  Answer:   Immediate  ? B12 and Folate Panel  ? ? ? ?Important Office Information ?Lab Results ?If labs were ordered, please note that you will see results through [place] as soon as they come available from [facility].  ?It takes up to 5 business days for the results to be routed to me and for me to review them once all of the lab results have come through from [facility]. I will make recommendations based on your results and send these through [place] or someone from the office  will call you to discuss. If your labs are abnormal, we may contact you to schedule a visit to discuss the results and make recommendations.  ?If you have not heard from [place] within 5 business days or you have waited longer than a week and your lab results have not come through on [place], please feel free to call the office or send a message through [place] to follow-up on these labs.  ? ?Referrals ?If referrals were placed today, the office where the referral was sent will contact you either by phone or through [place] to set up scheduling. Please note that it can take up to a week for the referral office to contact you. If you do not hear from them in a week, please contact the referral office directly to inquire about scheduling.  ? ?Condition Treated ?If your condition worsens or you begin to have new symptoms, please schedule a follow-up appointment for further evaluation. If you are not sure if an appointment is needed, you may call the office to leave a message for the nurse and someone will contact you with recommendations.  ?If you  have an urgent or life threatening emergency, please do not call the office, but seek emergency evaluation by calling 911 or going to the nearest emergency room for evaluation.  ? ?MyChart and Phone Calls ?Please do not use MyChart for urgent messages. It may take up to 3 business days for MyChart messages to be read by staff and if they are unable to handle the request, an additional 3 business days for them to be routed to me and for my response.  ?Messages sent to the provider through [place] do not come directly to the provider, please allow time for these messages to be routed and for me to respond.  ?We get a large volume of MyChart messages daily and these are responded to in the order received.  ? ?For urgent messages, please call the office at [phone] and speak with the front office staff or leave a message on the line of my assistant for guidance.  ?We are seeing  patients from the hours of 8:00 am through 5:00 pm and calls directly to the nurse may not be answered immediately due to seeing patients, but your call will be returned as soon as possible.  ?Phone  messages received after 4:00 PM Monday through Thursday may not be returned until the following business day. Phone messages received after 11:00 AM on Friday may not be returned until Monday.  ? ?After Hours ?We share on call hours with providers from other offices. If you have an urgent need after hours that cannot wait until the next business day, please contact the on call provider by calling the office number. A nurse will speak with you and contact the provider if needed for recommendations.  ?If you have an urgent or life threatening emergency after hours, please do not call the on call provider, but seek emergency evaluation by calling 911 or going to the nearest emergency room for evaluation.  ? ?Paperwork ?All paperwork requires a minimum of 5 days to complete and return to you or the designated personnel. Please keep this in mind when bringing in forms or sending requests for paperwork completion to the office.  ?  ?

## 2021-05-26 DIAGNOSIS — R61 Generalized hyperhidrosis: Secondary | ICD-10-CM | POA: Diagnosis not present

## 2021-05-26 DIAGNOSIS — Z0000 Encounter for general adult medical examination without abnormal findings: Secondary | ICD-10-CM | POA: Diagnosis not present

## 2021-05-26 DIAGNOSIS — I10 Essential (primary) hypertension: Secondary | ICD-10-CM | POA: Diagnosis not present

## 2021-05-26 DIAGNOSIS — R635 Abnormal weight gain: Secondary | ICD-10-CM | POA: Diagnosis not present

## 2021-05-27 ENCOUNTER — Other Ambulatory Visit (HOSPITAL_BASED_OUTPATIENT_CLINIC_OR_DEPARTMENT_OTHER): Payer: Self-pay

## 2021-05-28 ENCOUNTER — Other Ambulatory Visit (HOSPITAL_BASED_OUTPATIENT_CLINIC_OR_DEPARTMENT_OTHER): Payer: Self-pay

## 2021-05-30 LAB — COMPREHENSIVE METABOLIC PANEL
ALT: 11 IU/L (ref 0–32)
AST: 12 IU/L (ref 0–40)
Albumin/Globulin Ratio: 1.8 (ref 1.2–2.2)
Albumin: 4.8 g/dL (ref 3.8–4.8)
Alkaline Phosphatase: 69 IU/L (ref 44–121)
BUN/Creatinine Ratio: 13 (ref 9–23)
BUN: 11 mg/dL (ref 6–20)
Bilirubin Total: 0.5 mg/dL (ref 0.0–1.2)
CO2: 23 mmol/L (ref 20–29)
Calcium: 10.1 mg/dL (ref 8.7–10.2)
Chloride: 103 mmol/L (ref 96–106)
Creatinine, Ser: 0.84 mg/dL (ref 0.57–1.00)
Globulin, Total: 2.6 g/dL (ref 1.5–4.5)
Glucose: 86 mg/dL (ref 70–99)
Potassium: 4.1 mmol/L (ref 3.5–5.2)
Sodium: 141 mmol/L (ref 134–144)
Total Protein: 7.4 g/dL (ref 6.0–8.5)
eGFR: 91 mL/min/{1.73_m2} (ref >59)

## 2021-05-30 LAB — LIPID PANEL
Chol/HDL Ratio: 3.1 ratio (ref 0.0–4.4)
Cholesterol, Total: 160 mg/dL (ref 100–199)
HDL: 52 mg/dL (ref >39)
LDL Chol Calc (NIH): 93 mg/dL (ref 0–99)
Triglycerides: 79 mg/dL (ref 0–149)
VLDL Cholesterol Cal: 15 mg/dL (ref 5–40)

## 2021-05-30 LAB — CBC WITH DIFFERENTIAL/PLATELET
Basophils Absolute: 0 10*3/uL (ref 0.0–0.2)
Basos: 1 %
EOS (ABSOLUTE): 0.1 10*3/uL (ref 0.0–0.4)
Eos: 1 %
Hematocrit: 44 % (ref 34.0–46.6)
Hemoglobin: 14.7 g/dL (ref 11.1–15.9)
Immature Grans (Abs): 0 10*3/uL (ref 0.0–0.1)
Immature Granulocytes: 0 %
Lymphocytes Absolute: 1.5 10*3/uL (ref 0.7–3.1)
Lymphs: 22 %
MCH: 31.4 pg (ref 26.6–33.0)
MCHC: 33.4 g/dL (ref 31.5–35.7)
MCV: 94 fL (ref 79–97)
Monocytes Absolute: 0.5 10*3/uL (ref 0.1–0.9)
Monocytes: 7 %
Neutrophils Absolute: 4.8 10*3/uL (ref 1.4–7.0)
Neutrophils: 69 %
Platelets: 264 10*3/uL (ref 150–450)
RBC: 4.68 x10E6/uL (ref 3.77–5.28)
RDW: 12.1 % (ref 11.7–15.4)
WBC: 6.9 10*3/uL (ref 3.4–10.8)

## 2021-05-30 LAB — TSH: TSH: 0.956 u[IU]/mL (ref 0.450–4.500)

## 2021-05-30 LAB — HEMOGLOBIN A1C
Est. average glucose Bld gHb Est-mCnc: 94 mg/dL
Hgb A1c MFr Bld: 4.9 % (ref 4.8–5.6)

## 2021-05-30 LAB — ESTROGENS, TOTAL: Estrogen: 77 pg/mL

## 2021-05-30 LAB — VITAMIN D 25 HYDROXY (VIT D DEFICIENCY, FRACTURES): Vit D, 25-Hydroxy: 26.8 ng/mL — ABNORMAL LOW (ref 30.0–100.0)

## 2021-05-30 LAB — T4, FREE: Free T4: 1.53 ng/dL (ref 0.82–1.77)

## 2021-05-30 LAB — B12 AND FOLATE PANEL
Folate: 14.7 ng/mL (ref >3.0)
Vitamin B-12: 320 pg/mL (ref 232–1245)

## 2021-05-31 ENCOUNTER — Other Ambulatory Visit (HOSPITAL_BASED_OUTPATIENT_CLINIC_OR_DEPARTMENT_OTHER): Payer: Self-pay

## 2021-05-31 DIAGNOSIS — E559 Vitamin D deficiency, unspecified: Secondary | ICD-10-CM

## 2021-05-31 MED ORDER — VITAMIN D (ERGOCALCIFEROL) 1.25 MG (50000 UNIT) PO CAPS
50000.0000 [IU] | ORAL_CAPSULE | ORAL | 0 refills | Status: DC
  Filled 2021-05-31: qty 12, 84d supply, fill #0

## 2021-06-01 ENCOUNTER — Other Ambulatory Visit (HOSPITAL_BASED_OUTPATIENT_CLINIC_OR_DEPARTMENT_OTHER): Payer: Self-pay

## 2021-06-08 ENCOUNTER — Other Ambulatory Visit: Payer: Self-pay | Admitting: Nurse Practitioner

## 2021-06-08 ENCOUNTER — Other Ambulatory Visit (HOSPITAL_BASED_OUTPATIENT_CLINIC_OR_DEPARTMENT_OTHER): Payer: Self-pay

## 2021-06-08 MED ORDER — OLMESARTAN MEDOXOMIL-HCTZ 40-25 MG PO TABS
1.0000 | ORAL_TABLET | Freq: Every day | ORAL | 3 refills | Status: DC
  Filled 2021-06-08 (×2): qty 90, 90d supply, fill #0
  Filled 2021-09-06: qty 90, 90d supply, fill #1
  Filled 2021-12-12: qty 90, 90d supply, fill #2
  Filled 2022-04-11: qty 90, 90d supply, fill #3

## 2021-06-09 ENCOUNTER — Other Ambulatory Visit (HOSPITAL_BASED_OUTPATIENT_CLINIC_OR_DEPARTMENT_OTHER): Payer: Self-pay

## 2021-06-09 MED ORDER — COVID-19 AT HOME ANTIGEN TEST VI KIT
PACK | 0 refills | Status: DC
  Filled 2021-06-09: qty 4, 8d supply, fill #0

## 2021-06-11 ENCOUNTER — Other Ambulatory Visit (HOSPITAL_BASED_OUTPATIENT_CLINIC_OR_DEPARTMENT_OTHER): Payer: Self-pay

## 2021-06-14 ENCOUNTER — Other Ambulatory Visit (HOSPITAL_BASED_OUTPATIENT_CLINIC_OR_DEPARTMENT_OTHER): Payer: Self-pay

## 2021-06-14 ENCOUNTER — Encounter (HOSPITAL_BASED_OUTPATIENT_CLINIC_OR_DEPARTMENT_OTHER): Payer: Self-pay | Admitting: Nurse Practitioner

## 2021-06-14 MED ORDER — TRIAMCINOLONE ACETONIDE 0.1 % EX CREA
1.0000 "application " | TOPICAL_CREAM | Freq: Two times a day (BID) | CUTANEOUS | 0 refills | Status: DC
  Filled 2021-06-14: qty 30, 15d supply, fill #0

## 2021-06-17 ENCOUNTER — Other Ambulatory Visit (HOSPITAL_BASED_OUTPATIENT_CLINIC_OR_DEPARTMENT_OTHER): Payer: Self-pay

## 2021-06-21 ENCOUNTER — Ambulatory Visit: Payer: 59

## 2021-06-21 ENCOUNTER — Other Ambulatory Visit (HOSPITAL_BASED_OUTPATIENT_CLINIC_OR_DEPARTMENT_OTHER): Payer: Self-pay

## 2021-06-25 ENCOUNTER — Other Ambulatory Visit (HOSPITAL_BASED_OUTPATIENT_CLINIC_OR_DEPARTMENT_OTHER): Payer: Self-pay

## 2021-06-29 ENCOUNTER — Other Ambulatory Visit (HOSPITAL_BASED_OUTPATIENT_CLINIC_OR_DEPARTMENT_OTHER): Payer: Self-pay

## 2021-06-30 ENCOUNTER — Other Ambulatory Visit (HOSPITAL_BASED_OUTPATIENT_CLINIC_OR_DEPARTMENT_OTHER): Payer: Self-pay

## 2021-07-02 ENCOUNTER — Other Ambulatory Visit (HOSPITAL_BASED_OUTPATIENT_CLINIC_OR_DEPARTMENT_OTHER): Payer: Self-pay

## 2021-07-05 ENCOUNTER — Other Ambulatory Visit (HOSPITAL_BASED_OUTPATIENT_CLINIC_OR_DEPARTMENT_OTHER): Payer: Self-pay

## 2021-07-06 ENCOUNTER — Other Ambulatory Visit (HOSPITAL_BASED_OUTPATIENT_CLINIC_OR_DEPARTMENT_OTHER): Payer: Self-pay

## 2021-09-02 ENCOUNTER — Other Ambulatory Visit (HOSPITAL_BASED_OUTPATIENT_CLINIC_OR_DEPARTMENT_OTHER): Payer: Self-pay

## 2021-09-06 ENCOUNTER — Other Ambulatory Visit (HOSPITAL_BASED_OUTPATIENT_CLINIC_OR_DEPARTMENT_OTHER): Payer: Self-pay

## 2021-09-09 ENCOUNTER — Other Ambulatory Visit (HOSPITAL_BASED_OUTPATIENT_CLINIC_OR_DEPARTMENT_OTHER): Payer: Self-pay | Admitting: Nurse Practitioner

## 2021-09-09 ENCOUNTER — Other Ambulatory Visit (HOSPITAL_BASED_OUTPATIENT_CLINIC_OR_DEPARTMENT_OTHER): Payer: Self-pay

## 2021-09-09 DIAGNOSIS — H6981 Other specified disorders of Eustachian tube, right ear: Secondary | ICD-10-CM

## 2021-09-09 MED ORDER — LEVOCETIRIZINE DIHYDROCHLORIDE 5 MG PO TABS
5.0000 mg | ORAL_TABLET | Freq: Every evening | ORAL | 3 refills | Status: DC
  Filled 2021-09-09: qty 90, 90d supply, fill #0
  Filled 2021-11-08 – 2021-11-23 (×2): qty 90, 90d supply, fill #1
  Filled 2022-01-04: qty 90, 90d supply, fill #2
  Filled 2022-03-15: qty 90, 90d supply, fill #3

## 2021-09-17 ENCOUNTER — Other Ambulatory Visit (HOSPITAL_BASED_OUTPATIENT_CLINIC_OR_DEPARTMENT_OTHER): Payer: Self-pay

## 2021-09-22 ENCOUNTER — Other Ambulatory Visit (HOSPITAL_BASED_OUTPATIENT_CLINIC_OR_DEPARTMENT_OTHER): Payer: Self-pay

## 2021-09-22 ENCOUNTER — Ambulatory Visit (INDEPENDENT_AMBULATORY_CARE_PROVIDER_SITE_OTHER): Payer: 59 | Admitting: Podiatry

## 2021-09-22 ENCOUNTER — Ambulatory Visit (INDEPENDENT_AMBULATORY_CARE_PROVIDER_SITE_OTHER): Payer: 59

## 2021-09-22 DIAGNOSIS — M722 Plantar fascial fibromatosis: Secondary | ICD-10-CM | POA: Diagnosis not present

## 2021-09-22 MED ORDER — BETAMETHASONE SOD PHOS & ACET 6 (3-3) MG/ML IJ SUSP
3.0000 mg | Freq: Once | INTRAMUSCULAR | Status: AC
  Administered 2021-09-22: 3 mg via INTRA_ARTICULAR

## 2021-09-22 MED ORDER — METHYLPREDNISOLONE 4 MG PO TBPK
ORAL_TABLET | ORAL | 0 refills | Status: DC
  Filled 2021-09-22: qty 21, 6d supply, fill #0

## 2021-09-22 MED ORDER — MELOXICAM 15 MG PO TABS
15.0000 mg | ORAL_TABLET | Freq: Every day | ORAL | 1 refills | Status: DC
  Filled 2021-09-22: qty 30, 30d supply, fill #0

## 2021-09-22 NOTE — Progress Notes (Signed)
   Chief Complaint  Patient presents with   Plantar Fasciitis    RM 7  Bilateral arch and heel pain right over left x 8 months. Pain worse at first steps. PT has tried inserts and insoles that have not helped.     Subjective: 41 y.o. female presenting today as a new patient for evaluation of bilateral heel pain this been going on for about 8 months now.  She cannot recall an incident or injury or activity that would have elicited the pain.  She wears good supportive Brooks running shoes.  She has also done some stretching exercises and physical therapy at home to help alleviate some of her symptoms with minimal relief.  She presents for further treatment and evaluation   Past Medical History:  Diagnosis Date   Ankle sprain 05/21/2020   History of premature delivery 03/26/2018   Kidney stone 08/07   Kidney stone 01/31/2005   Non-recurrent acute suppurative otitis media of right ear without spontaneous rupture of tympanic membrane 05/10/2021   Rectal fissure    Wheezing 05/10/2021   No past surgical history on file. No Active Allergies   Objective: Physical Exam General: The patient is alert and oriented x3 in no acute distress.  Dermatology: Skin is warm, dry and supple bilateral lower extremities. Negative for open lesions or macerations bilateral.   Vascular: Dorsalis Pedis and Posterior Tibial pulses palpable bilateral.  Capillary fill time is immediate to all digits.  Neurological: Epicritic and protective threshold intact bilateral.   Musculoskeletal: Tenderness to palpation to the plantar aspect of the bilateral heels along the plantar fascia. All other joints range of motion within normal limits bilateral. Strength 5/5 in all groups bilateral.   Radiographic exam: Normal osseous mineralization. Joint spaces preserved. No fracture/dislocation/boney destruction. No other soft tissue abnormalities or radiopaque foreign bodies.   Assessment: 1. plantar fasciitis bilateral feet.  RT>LT  Plan of Care:  1. Patient evaluated. Xrays reviewed.   2. Injection of 0.5cc Celestone soluspan injected into the bilateral heels.  3. Rx for Medrol Dose Pak placed 4. Rx for Meloxicam ordered for patient. 5. Plantar fascial band(s) dispensed for bilateral plantar fasciitis. 6. Instructed patient regarding therapies and modalities at home to alleviate symptoms.  Advised against going barefoot 7.  OTC power step insoles dispensed.  Wear daily in her [employer] running shoes  8.  Night splint dispensed.  Wear nightly  9.  Return to clinic in 4 weeks.    *Pharmacist at [facility].  [facility] referral.   Edrick Kins, DPM [facility]  Dr. Edrick Kins, DPM    [address], [place] [address]                Office [phone]  Fax [phone]

## 2021-09-27 ENCOUNTER — Other Ambulatory Visit (HOSPITAL_BASED_OUTPATIENT_CLINIC_OR_DEPARTMENT_OTHER): Payer: Self-pay

## 2021-10-06 ENCOUNTER — Other Ambulatory Visit (HOSPITAL_BASED_OUTPATIENT_CLINIC_OR_DEPARTMENT_OTHER): Payer: Self-pay

## 2021-10-18 ENCOUNTER — Ambulatory Visit (INDEPENDENT_AMBULATORY_CARE_PROVIDER_SITE_OTHER): Payer: 59 | Admitting: Podiatry

## 2021-10-18 DIAGNOSIS — M722 Plantar fascial fibromatosis: Secondary | ICD-10-CM

## 2021-10-18 MED ORDER — BETAMETHASONE SOD PHOS & ACET 6 (3-3) MG/ML IJ SUSP
3.0000 mg | Freq: Once | INTRAMUSCULAR | Status: AC
  Administered 2021-10-18: 3 mg via INTRA_ARTICULAR

## 2021-10-18 NOTE — Progress Notes (Signed)
   Chief Complaint  Patient presents with   Plantar Fasciitis    Patient is here for follow-up for plantar fasciitis, she states that tings are better, but not where she would like to be.    Subjective: 42 y.o. female presenting today for follow-up evaluation of plantar fasciitis to the bilateral feet has been going on for several months now.  She says that the left is minimally symptomatic.  She continues to have pain and tenderness associated to the right plantar fascia.  Overall she does have improvement however.  She continues to wear the night splint and plantar fascial brace during the day.  She is also taking the meloxicam daily.   Past Medical History:  Diagnosis Date   Ankle sprain 05/21/2020   History of premature delivery 03/26/2018   Kidney stone 08/07   Kidney stone 01/31/2005   Non-recurrent acute suppurative otitis media of right ear without spontaneous rupture of tympanic membrane 05/10/2021   Rectal fissure    Wheezing 05/10/2021   No past surgical history on file. No Known Allergies   Objective: Physical Exam General: The patient is alert and oriented x3 in no acute distress.  Dermatology: Skin is warm, dry and supple bilateral lower extremities. Negative for open lesions or macerations bilateral.   Vascular: Dorsalis Pedis and Posterior Tibial pulses palpable bilateral.  Capillary fill time is immediate to all digits.  Neurological: Epicritic and protective threshold intact bilateral.   Musculoskeletal: Tenderness to palpation to the plantar aspect of the bilateral heels along the plantar fascia.  The right plantar fascia is much more symptomatic than the left.  The left plantar fascia is minimally symptomatic with palpation. All other joints range of motion within normal limits bilateral. Strength 5/5 in all groups bilateral.   Radiographic exam B/L feet 09/22/2021: Normal osseous mineralization. Joint spaces preserved. No fracture/dislocation/boney destruction. No  other soft tissue abnormalities or radiopaque foreign bodies.   Assessment: 1. plantar fasciitis bilateral feet. RT>LT  Plan of Care:  1. Patient evaluated.  2. Injection of 0.5cc Celestone soluspan injected into the right plantar fascia 3.  Continue meloxicam 15 mg daily 4.  Continue plantar fascial brace and night splint 5.  Continue Brooks running shoes 6.  Continue daily stretching exercises 7.  Recommend reduced activity.  Patient enjoys [employer] exercising which create high impact to the feet 8.  Return to clinic 1 month  *Pharmacist at [facility] [facility] [facility].  [facility] referral.   Edrick Kins, DPM [facility]  Dr. Edrick Kins, DPM    [address], [place] [address]                Office [phone]  Fax [phone]

## 2021-10-20 ENCOUNTER — Other Ambulatory Visit (HOSPITAL_BASED_OUTPATIENT_CLINIC_OR_DEPARTMENT_OTHER): Payer: Self-pay | Admitting: Nurse Practitioner

## 2021-10-20 ENCOUNTER — Other Ambulatory Visit (HOSPITAL_BASED_OUTPATIENT_CLINIC_OR_DEPARTMENT_OTHER): Payer: Self-pay

## 2021-10-20 DIAGNOSIS — Z6828 Body mass index (BMI) 28.0-28.9, adult: Secondary | ICD-10-CM

## 2021-10-20 MED ORDER — PHENTERMINE HCL 37.5 MG PO TABS
37.5000 mg | ORAL_TABLET | Freq: Every morning | ORAL | 2 refills | Status: DC
  Filled 2021-10-20: qty 30, 30d supply, fill #0
  Filled 2021-11-22: qty 30, 30d supply, fill #1
  Filled 2022-01-03: qty 30, 30d supply, fill #2

## 2021-10-21 ENCOUNTER — Other Ambulatory Visit (HOSPITAL_BASED_OUTPATIENT_CLINIC_OR_DEPARTMENT_OTHER): Payer: Self-pay

## 2021-11-08 ENCOUNTER — Other Ambulatory Visit (HOSPITAL_BASED_OUTPATIENT_CLINIC_OR_DEPARTMENT_OTHER): Payer: Self-pay

## 2021-11-10 ENCOUNTER — Other Ambulatory Visit (HOSPITAL_BASED_OUTPATIENT_CLINIC_OR_DEPARTMENT_OTHER): Payer: Self-pay

## 2021-11-16 ENCOUNTER — Telehealth: Payer: Self-pay | Admitting: Podiatry

## 2021-11-16 ENCOUNTER — Other Ambulatory Visit (HOSPITAL_BASED_OUTPATIENT_CLINIC_OR_DEPARTMENT_OTHER): Payer: Self-pay

## 2021-11-16 MED ORDER — INFLUENZA VAC SPLIT QUAD 0.5 ML IM SUSY
PREFILLED_SYRINGE | INTRAMUSCULAR | 0 refills | Status: DC
  Filled 2021-11-16: qty 0.5, 1d supply, fill #0

## 2021-11-16 NOTE — Telephone Encounter (Signed)
Pt cxled appt thru my chart and I called to reschedule but she stated she does not know her schedule and will reschedule thru my chart.

## 2021-11-17 ENCOUNTER — Encounter: Payer: 59 | Admitting: Podiatry

## 2021-11-17 ENCOUNTER — Other Ambulatory Visit (HOSPITAL_BASED_OUTPATIENT_CLINIC_OR_DEPARTMENT_OTHER): Payer: Self-pay

## 2021-11-22 ENCOUNTER — Other Ambulatory Visit (HOSPITAL_BASED_OUTPATIENT_CLINIC_OR_DEPARTMENT_OTHER): Payer: Self-pay

## 2021-11-23 ENCOUNTER — Other Ambulatory Visit (HOSPITAL_BASED_OUTPATIENT_CLINIC_OR_DEPARTMENT_OTHER): Payer: Self-pay

## 2021-12-01 ENCOUNTER — Other Ambulatory Visit (HOSPITAL_BASED_OUTPATIENT_CLINIC_OR_DEPARTMENT_OTHER): Payer: Self-pay

## 2021-12-13 ENCOUNTER — Other Ambulatory Visit (HOSPITAL_BASED_OUTPATIENT_CLINIC_OR_DEPARTMENT_OTHER): Payer: Self-pay

## 2022-01-03 ENCOUNTER — Other Ambulatory Visit (HOSPITAL_BASED_OUTPATIENT_CLINIC_OR_DEPARTMENT_OTHER): Payer: Self-pay

## 2022-01-04 ENCOUNTER — Other Ambulatory Visit (HOSPITAL_BASED_OUTPATIENT_CLINIC_OR_DEPARTMENT_OTHER): Payer: Self-pay

## 2022-01-15 ENCOUNTER — Other Ambulatory Visit (HOSPITAL_BASED_OUTPATIENT_CLINIC_OR_DEPARTMENT_OTHER): Payer: Self-pay

## 2022-02-01 DIAGNOSIS — L820 Inflamed seborrheic keratosis: Secondary | ICD-10-CM | POA: Diagnosis not present

## 2022-02-01 DIAGNOSIS — D225 Melanocytic nevi of trunk: Secondary | ICD-10-CM | POA: Diagnosis not present

## 2022-02-01 IMAGING — DX DG ANKLE COMPLETE 3+V*R*
3 series · 3 of 3 positions shown · non-contrast
Comparison: None.

CLINICAL DATA: Trip and fall yesterday with ankle pain, initial
encounter

EXAM:
RIGHT ANKLE - COMPLETE 3+ VIEW

[ankle ap]
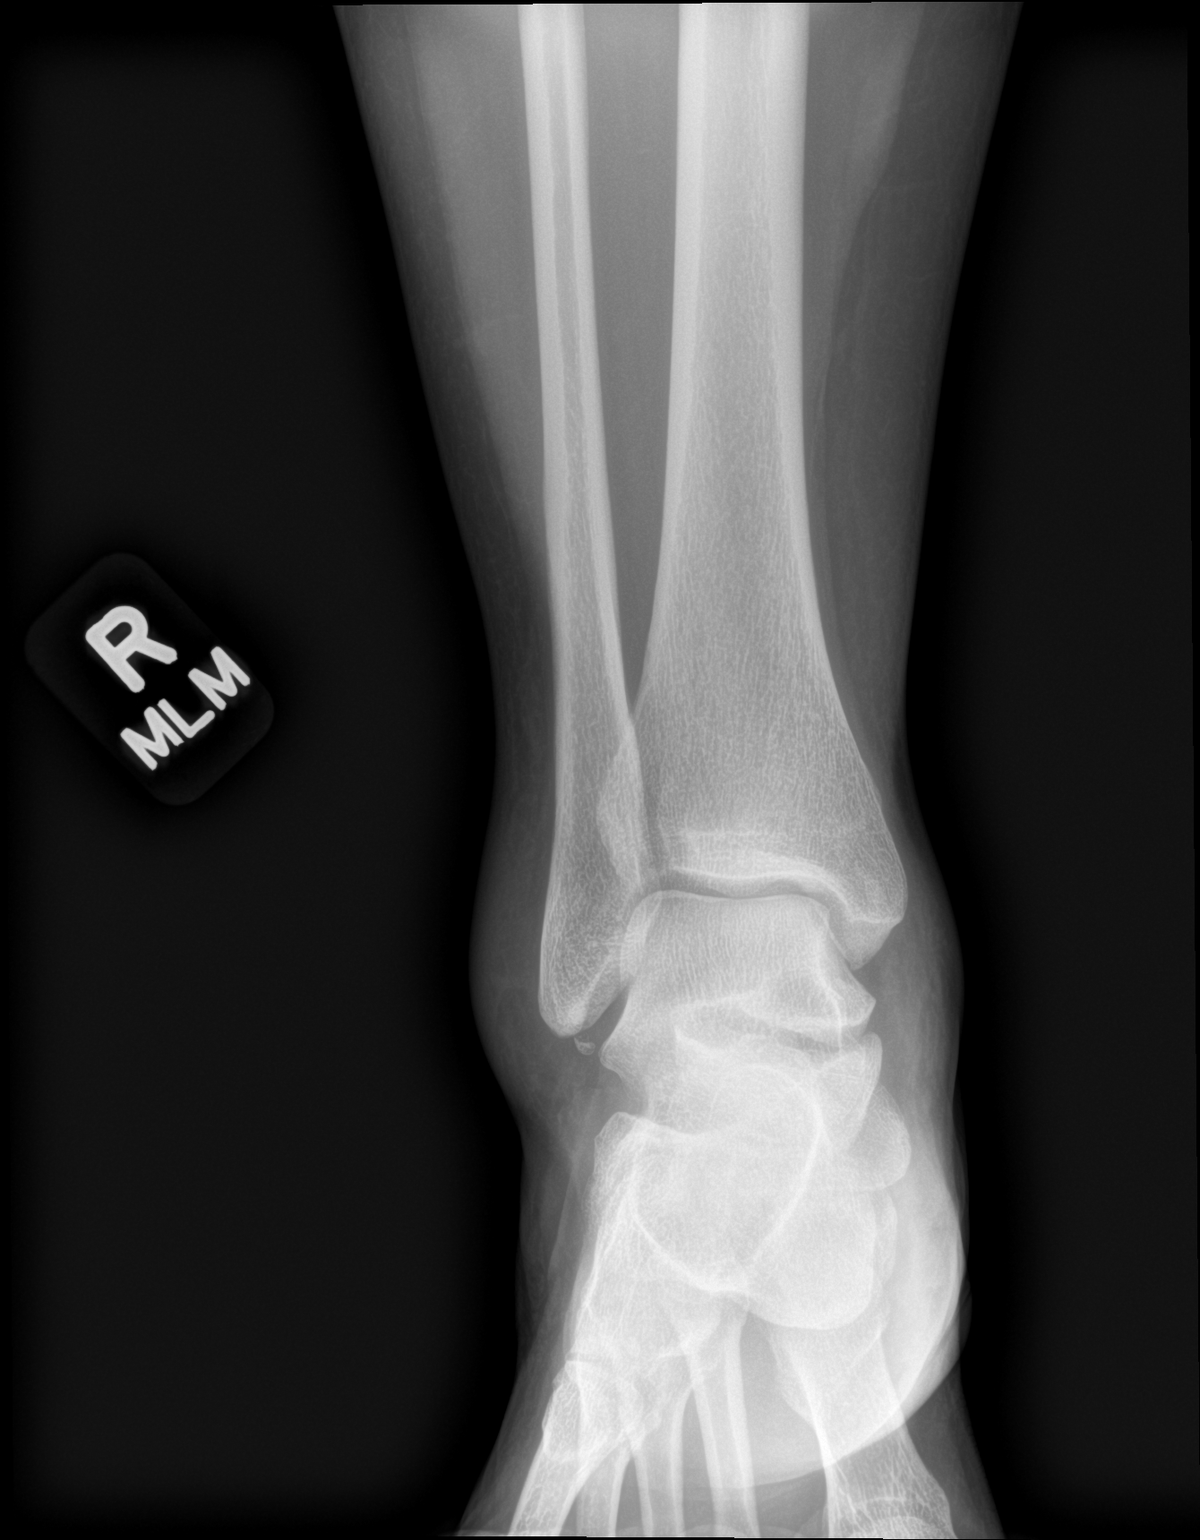

[ankle obl]
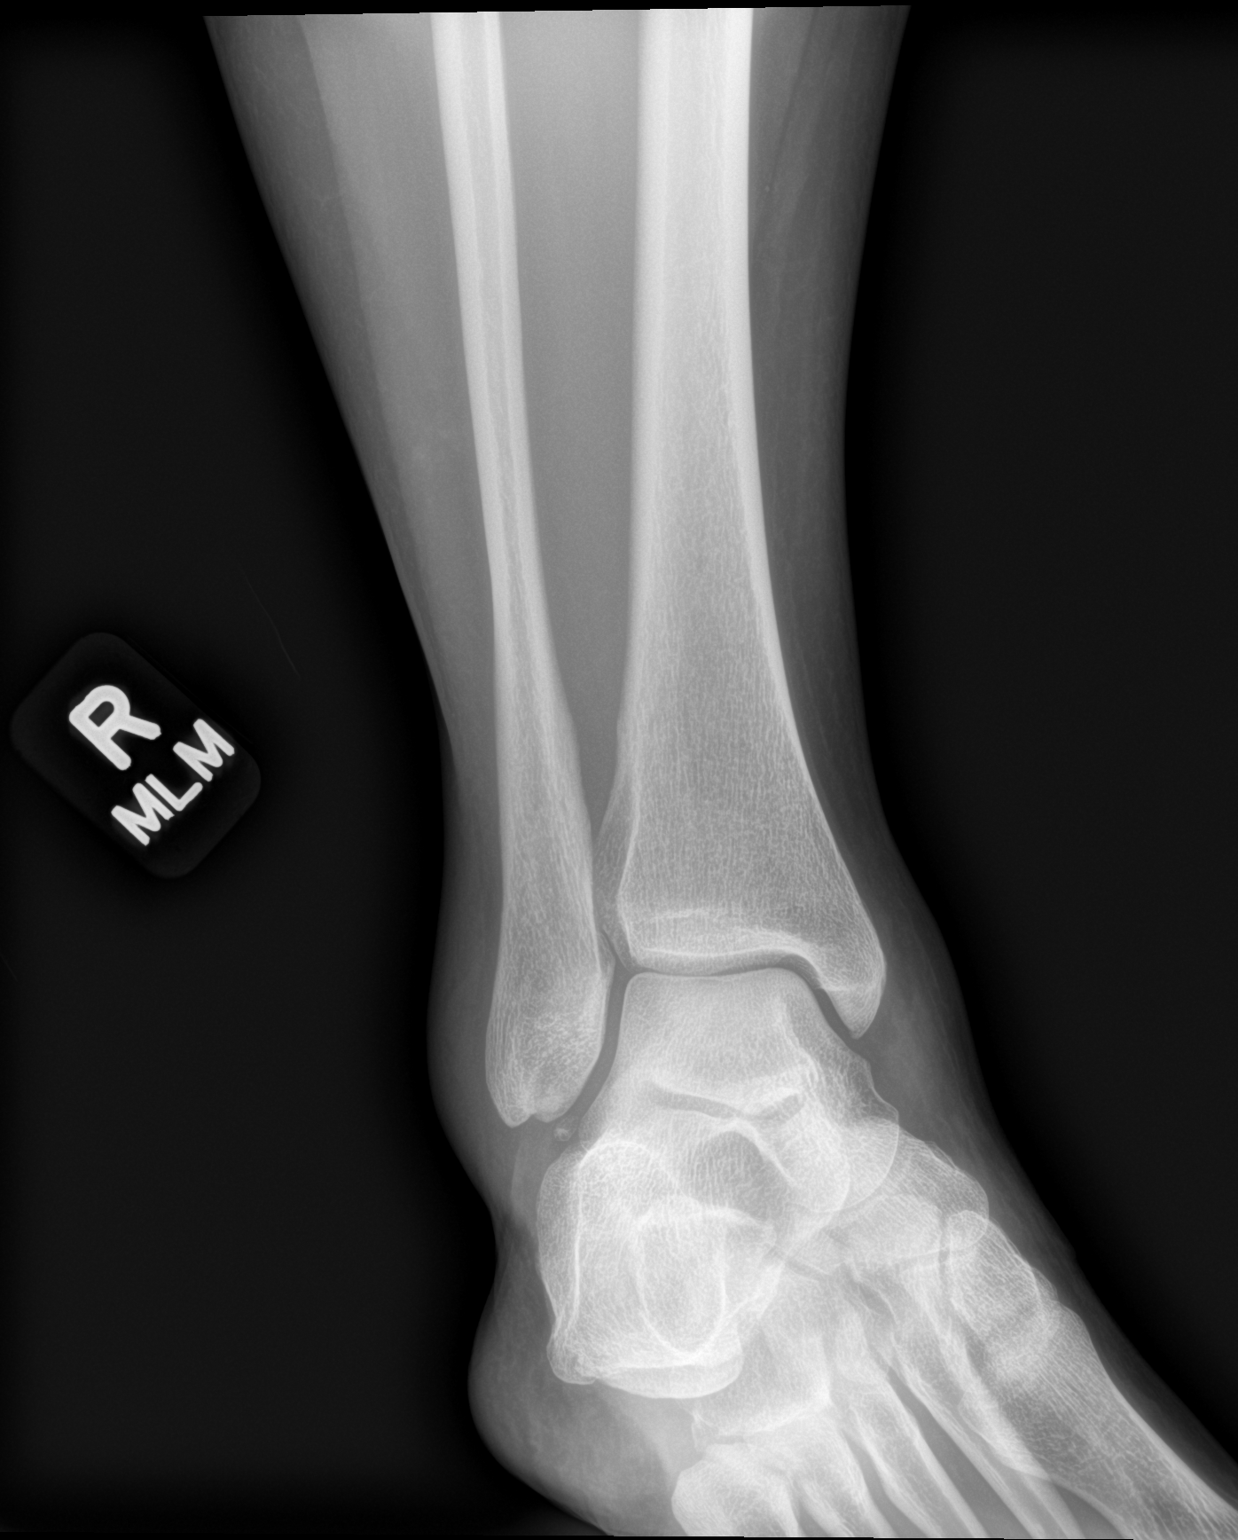

[ankle lat]
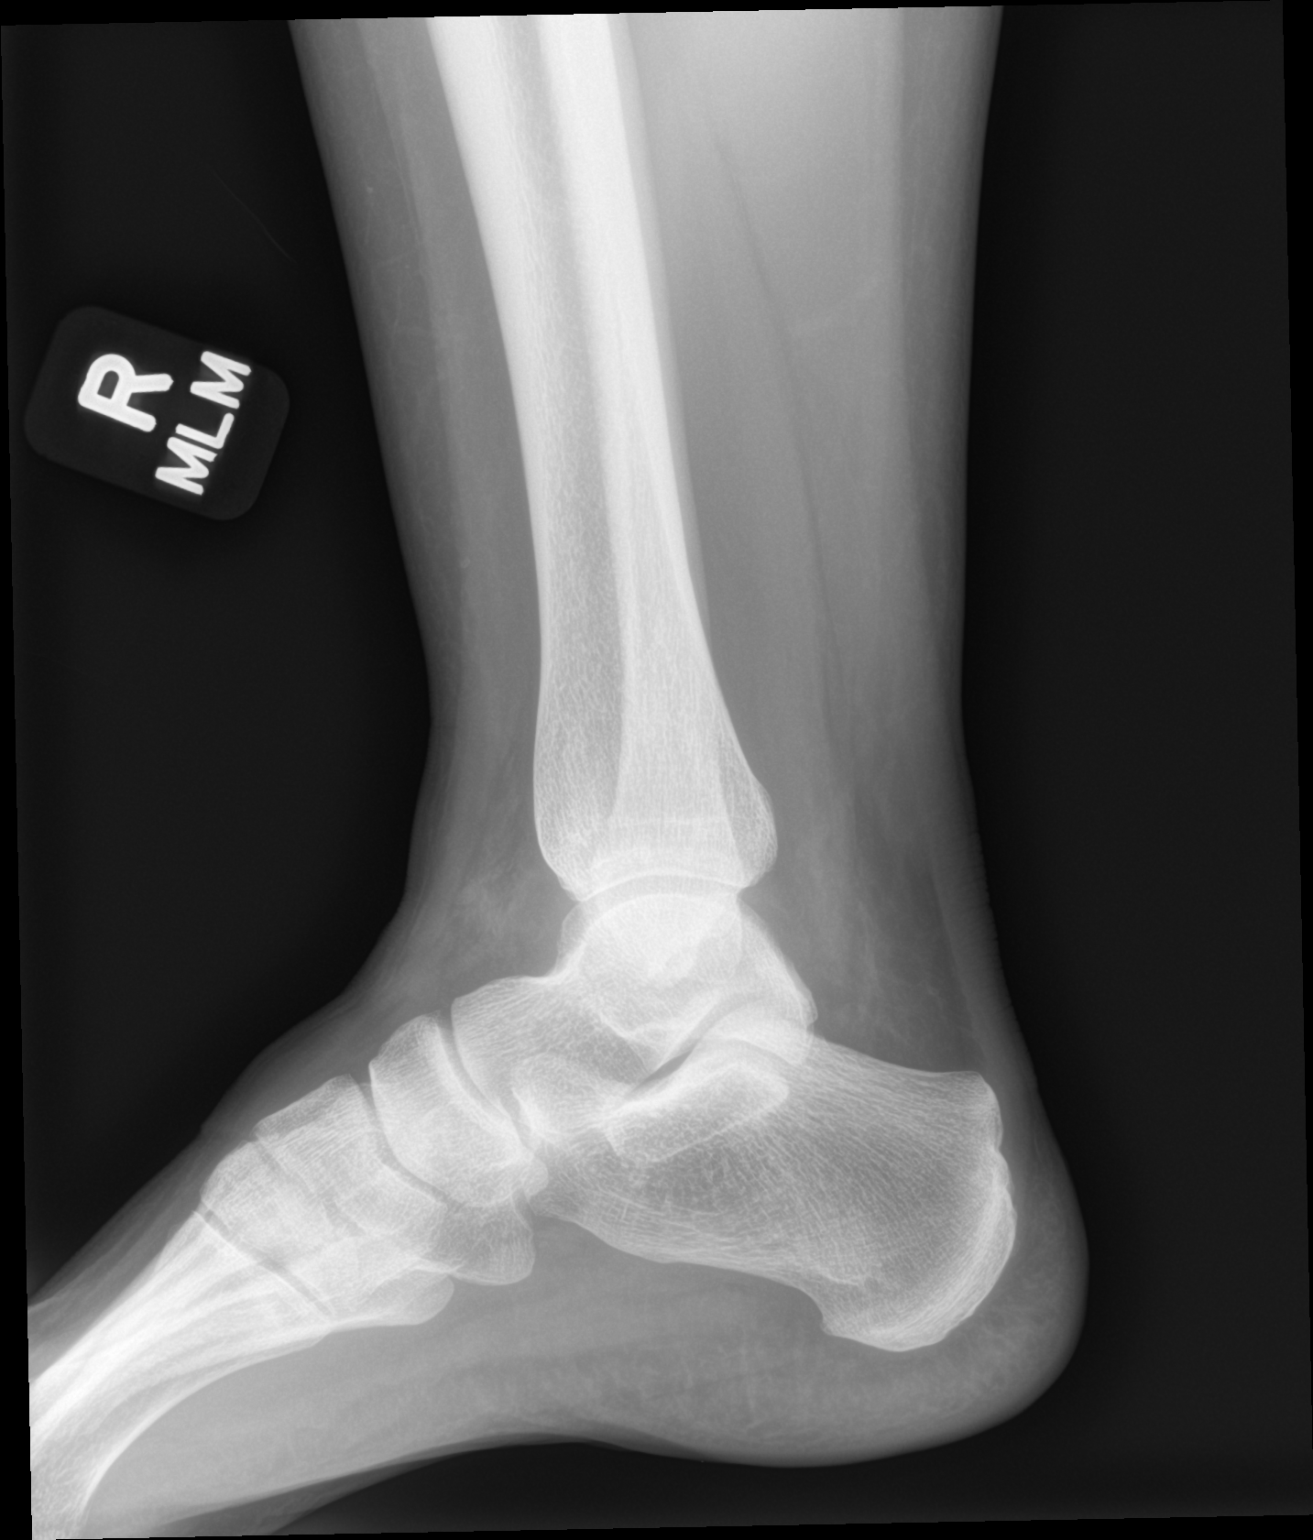

[3 of 3 positions shown; findings below may reference images not displayed]

FINDINGS: Soft tissue swelling is noted about the ankle particularly
laterally. There is a well corticated bony density noted adjacent to
the lateral malleolus consistent with prior trauma and nonunion. No
acute fracture or dislocation is seen.
IMPRESSION: Soft tissue swelling without acute bony abnormality.

Changes of prior trauma with nonunion are noted.

## 2022-02-10 ENCOUNTER — Other Ambulatory Visit (HOSPITAL_BASED_OUTPATIENT_CLINIC_OR_DEPARTMENT_OTHER): Payer: Self-pay | Admitting: Nurse Practitioner

## 2022-02-10 DIAGNOSIS — Z6828 Body mass index (BMI) 28.0-28.9, adult: Secondary | ICD-10-CM

## 2022-02-10 NOTE — Telephone Encounter (Signed)
Refill request pts. Last apt 05/25/21 with you will send a mychart message for her to schedule an apt. Here.

## 2022-02-14 ENCOUNTER — Other Ambulatory Visit (HOSPITAL_BASED_OUTPATIENT_CLINIC_OR_DEPARTMENT_OTHER): Payer: Self-pay

## 2022-02-14 MED ORDER — PHENTERMINE HCL 37.5 MG PO TABS
37.5000 mg | ORAL_TABLET | Freq: Every morning | ORAL | 2 refills | Status: DC
  Filled 2022-02-14: qty 30, 30d supply, fill #0
  Filled 2022-03-15: qty 30, 30d supply, fill #1
  Filled 2022-04-11: qty 30, 30d supply, fill #2

## 2022-02-28 ENCOUNTER — Other Ambulatory Visit (HOSPITAL_BASED_OUTPATIENT_CLINIC_OR_DEPARTMENT_OTHER): Payer: Self-pay

## 2022-03-11 ENCOUNTER — Other Ambulatory Visit (HOSPITAL_BASED_OUTPATIENT_CLINIC_OR_DEPARTMENT_OTHER): Payer: Self-pay

## 2022-03-15 ENCOUNTER — Other Ambulatory Visit (HOSPITAL_BASED_OUTPATIENT_CLINIC_OR_DEPARTMENT_OTHER): Payer: Self-pay

## 2022-04-04 ENCOUNTER — Other Ambulatory Visit (HOSPITAL_BASED_OUTPATIENT_CLINIC_OR_DEPARTMENT_OTHER): Payer: Self-pay

## 2022-04-11 ENCOUNTER — Other Ambulatory Visit (HOSPITAL_BASED_OUTPATIENT_CLINIC_OR_DEPARTMENT_OTHER): Payer: Self-pay

## 2022-04-29 ENCOUNTER — Other Ambulatory Visit (HOSPITAL_BASED_OUTPATIENT_CLINIC_OR_DEPARTMENT_OTHER): Payer: Self-pay

## 2022-04-29 ENCOUNTER — Other Ambulatory Visit (HOSPITAL_BASED_OUTPATIENT_CLINIC_OR_DEPARTMENT_OTHER): Payer: Self-pay | Admitting: Nurse Practitioner

## 2022-04-29 DIAGNOSIS — H6991 Unspecified Eustachian tube disorder, right ear: Secondary | ICD-10-CM

## 2022-05-02 ENCOUNTER — Other Ambulatory Visit (HOSPITAL_BASED_OUTPATIENT_CLINIC_OR_DEPARTMENT_OTHER): Payer: Self-pay

## 2022-05-02 MED ORDER — LEVOCETIRIZINE DIHYDROCHLORIDE 5 MG PO TABS
5.0000 mg | ORAL_TABLET | Freq: Every evening | ORAL | 0 refills | Status: DC
  Filled 2022-05-02: qty 90, 90d supply, fill #0

## 2022-05-06 ENCOUNTER — Other Ambulatory Visit (HOSPITAL_BASED_OUTPATIENT_CLINIC_OR_DEPARTMENT_OTHER): Payer: Self-pay

## 2022-05-11 ENCOUNTER — Other Ambulatory Visit (HOSPITAL_BASED_OUTPATIENT_CLINIC_OR_DEPARTMENT_OTHER): Payer: Self-pay | Admitting: Nurse Practitioner

## 2022-05-11 DIAGNOSIS — Z6828 Body mass index (BMI) 28.0-28.9, adult: Secondary | ICD-10-CM

## 2022-05-12 NOTE — Telephone Encounter (Signed)
Refill request last apt 05/25/21. I sent pt. Mychart message to please call and schedule an apt. Since its been almost a year since her last apt.

## 2022-05-18 ENCOUNTER — Other Ambulatory Visit (HOSPITAL_BASED_OUTPATIENT_CLINIC_OR_DEPARTMENT_OTHER): Payer: Self-pay

## 2022-05-18 MED ORDER — PHENTERMINE HCL 37.5 MG PO TABS
37.5000 mg | ORAL_TABLET | Freq: Every morning | ORAL | 2 refills | Status: DC
  Filled 2022-05-18: qty 30, 30d supply, fill #0
  Filled 2022-07-05: qty 30, 30d supply, fill #1
  Filled 2022-08-13: qty 30, 30d supply, fill #2

## 2022-05-31 ENCOUNTER — Other Ambulatory Visit (HOSPITAL_BASED_OUTPATIENT_CLINIC_OR_DEPARTMENT_OTHER): Payer: Self-pay

## 2022-06-03 ENCOUNTER — Other Ambulatory Visit (HOSPITAL_BASED_OUTPATIENT_CLINIC_OR_DEPARTMENT_OTHER): Payer: Self-pay

## 2022-06-10 ENCOUNTER — Other Ambulatory Visit (HOSPITAL_BASED_OUTPATIENT_CLINIC_OR_DEPARTMENT_OTHER): Payer: Self-pay

## 2022-06-10 MED ORDER — NORETHIN ACE-ETH ESTRAD-FE 1-20 MG-MCG PO TABS
1.0000 | ORAL_TABLET | Freq: Every day | ORAL | 0 refills | Status: DC
  Filled 2022-06-10: qty 84, 84d supply, fill #0

## 2022-06-29 DIAGNOSIS — N6310 Unspecified lump in the right breast, unspecified quadrant: Secondary | ICD-10-CM | POA: Diagnosis not present

## 2022-07-01 ENCOUNTER — Other Ambulatory Visit: Payer: Self-pay | Admitting: Obstetrics and Gynecology

## 2022-07-01 DIAGNOSIS — N63 Unspecified lump in unspecified breast: Secondary | ICD-10-CM

## 2022-07-05 ENCOUNTER — Other Ambulatory Visit (HOSPITAL_BASED_OUTPATIENT_CLINIC_OR_DEPARTMENT_OTHER): Payer: Self-pay | Admitting: Nurse Practitioner

## 2022-07-05 ENCOUNTER — Other Ambulatory Visit: Payer: Self-pay | Admitting: Nurse Practitioner

## 2022-07-05 ENCOUNTER — Other Ambulatory Visit (HOSPITAL_BASED_OUTPATIENT_CLINIC_OR_DEPARTMENT_OTHER): Payer: Self-pay

## 2022-07-05 DIAGNOSIS — H6991 Unspecified Eustachian tube disorder, right ear: Secondary | ICD-10-CM

## 2022-07-05 DIAGNOSIS — Z1389 Encounter for screening for other disorder: Secondary | ICD-10-CM | POA: Diagnosis not present

## 2022-07-05 DIAGNOSIS — N6310 Unspecified lump in the right breast, unspecified quadrant: Secondary | ICD-10-CM | POA: Diagnosis not present

## 2022-07-05 DIAGNOSIS — Z3041 Encounter for surveillance of contraceptive pills: Secondary | ICD-10-CM | POA: Diagnosis not present

## 2022-07-05 DIAGNOSIS — Z01419 Encounter for gynecological examination (general) (routine) without abnormal findings: Secondary | ICD-10-CM | POA: Diagnosis not present

## 2022-07-05 MED ORDER — NORETHIN ACE-ETH ESTRAD-FE 1-20 MG-MCG PO TABS
1.0000 | ORAL_TABLET | Freq: Every day | ORAL | 5 refills | Status: DC
  Filled 2022-07-05: qty 112, 84d supply, fill #0
  Filled 2022-08-20: qty 28, 21d supply, fill #0
  Filled 2022-08-22: qty 112, 84d supply, fill #0
  Filled 2022-11-15: qty 112, 84d supply, fill #1
  Filled 2023-01-23 – 2023-01-24 (×2): qty 112, 84d supply, fill #2
  Filled 2023-04-04 – 2023-04-25 (×2): qty 112, 84d supply, fill #3

## 2022-07-06 ENCOUNTER — Other Ambulatory Visit (HOSPITAL_BASED_OUTPATIENT_CLINIC_OR_DEPARTMENT_OTHER): Payer: Self-pay

## 2022-07-06 ENCOUNTER — Other Ambulatory Visit: Payer: Self-pay

## 2022-07-06 MED ORDER — OLMESARTAN MEDOXOMIL-HCTZ 40-25 MG PO TABS
1.0000 | ORAL_TABLET | Freq: Every day | ORAL | 0 refills | Status: AC
  Filled 2022-07-06: qty 90, 90d supply, fill #0

## 2022-07-06 MED ORDER — LEVOCETIRIZINE DIHYDROCHLORIDE 5 MG PO TABS
5.0000 mg | ORAL_TABLET | Freq: Every evening | ORAL | 0 refills | Status: DC
Start: 2022-07-06 — End: 2022-10-14
  Filled 2022-07-06: qty 90, 90d supply, fill #0

## 2022-07-26 ENCOUNTER — Ambulatory Visit
Admission: RE | Admit: 2022-07-26 | Discharge: 2022-07-26 | Discharge status: Home / Self Care | Payer: 59 | Source: Ambulatory Visit | Attending: Obstetrics and Gynecology | Admitting: Obstetrics and Gynecology

## 2022-07-26 ENCOUNTER — Ambulatory Visit
Admission: RE | Admit: 2022-07-26 | Discharge: 2022-07-26 | Disposition: A | Discharge status: Home / Self Care | Payer: Commercial Managed Care - PPO | Source: Ambulatory Visit | Attending: Obstetrics and Gynecology | Admitting: Obstetrics and Gynecology

## 2022-07-26 DIAGNOSIS — N63 Unspecified lump in unspecified breast: Secondary | ICD-10-CM

## 2022-07-26 DIAGNOSIS — N6311 Unspecified lump in the right breast, upper outer quadrant: Secondary | ICD-10-CM | POA: Diagnosis not present

## 2022-07-28 ENCOUNTER — Other Ambulatory Visit: Payer: Commercial Managed Care - PPO

## 2022-07-29 ENCOUNTER — Other Ambulatory Visit (HOSPITAL_BASED_OUTPATIENT_CLINIC_OR_DEPARTMENT_OTHER): Payer: Self-pay

## 2022-08-15 ENCOUNTER — Other Ambulatory Visit: Payer: Self-pay

## 2022-08-21 ENCOUNTER — Other Ambulatory Visit (HOSPITAL_BASED_OUTPATIENT_CLINIC_OR_DEPARTMENT_OTHER): Payer: Self-pay

## 2022-08-22 ENCOUNTER — Other Ambulatory Visit (HOSPITAL_COMMUNITY): Payer: Self-pay

## 2022-08-22 ENCOUNTER — Other Ambulatory Visit (HOSPITAL_BASED_OUTPATIENT_CLINIC_OR_DEPARTMENT_OTHER): Payer: Self-pay

## 2022-09-05 ENCOUNTER — Other Ambulatory Visit (HOSPITAL_BASED_OUTPATIENT_CLINIC_OR_DEPARTMENT_OTHER): Payer: Self-pay

## 2022-09-05 ENCOUNTER — Ambulatory Visit: Payer: 59 | Admitting: Nurse Practitioner

## 2022-09-05 VITALS — BP 126/80 | HR 84 | Ht 66.25 in | Wt 158.6 lb

## 2022-09-05 DIAGNOSIS — Z0000 Encounter for general adult medical examination without abnormal findings: Secondary | ICD-10-CM | POA: Diagnosis not present

## 2022-09-05 DIAGNOSIS — M79662 Pain in left lower leg: Secondary | ICD-10-CM

## 2022-09-05 DIAGNOSIS — R635 Abnormal weight gain: Secondary | ICD-10-CM

## 2022-09-05 DIAGNOSIS — L719 Rosacea, unspecified: Secondary | ICD-10-CM | POA: Diagnosis not present

## 2022-09-05 DIAGNOSIS — I10 Essential (primary) hypertension: Secondary | ICD-10-CM

## 2022-09-05 DIAGNOSIS — R61 Generalized hyperhidrosis: Secondary | ICD-10-CM | POA: Diagnosis not present

## 2022-09-05 LAB — COMPREHENSIVE METABOLIC PANEL

## 2022-09-05 LAB — CBC WITH DIFFERENTIAL/PLATELET
Basophils Absolute: 0 10*3/uL (ref 0.0–0.2)
Basos: 0 %
EOS (ABSOLUTE): 0.1 10*3/uL (ref 0.0–0.4)
Eos: 1 %
Hematocrit: 43.4 % (ref 34.0–46.6)
Hemoglobin: 14.5 g/dL (ref 11.1–15.9)
Immature Grans (Abs): 0 10*3/uL (ref 0.0–0.1)
Immature Granulocytes: 0 %
Lymphocytes Absolute: 1.5 10*3/uL (ref 0.7–3.1)
Lymphs: 17 %
MCH: 30.9 pg (ref 26.6–33.0)
MCHC: 33.4 g/dL (ref 31.5–35.7)
MCV: 92 fL (ref 79–97)
Monocytes Absolute: 0.6 10*3/uL (ref 0.1–0.9)
Monocytes: 7 %
Neutrophils Absolute: 6.7 10*3/uL (ref 1.4–7.0)
Neutrophils: 75 %
Platelets: 267 10*3/uL (ref 150–450)
RBC: 4.7 x10E6/uL (ref 3.77–5.28)
RDW: 11.2 % — ABNORMAL LOW (ref 11.7–15.4)
WBC: 9 10*3/uL (ref 3.4–10.8)

## 2022-09-05 LAB — LIPID PANEL

## 2022-09-05 LAB — TSH

## 2022-09-05 LAB — HEPATITIS C ANTIBODY

## 2022-09-05 LAB — D-DIMER, QUANTITATIVE

## 2022-09-05 LAB — HIV ANTIBODY (ROUTINE TESTING W REFLEX)

## 2022-09-05 LAB — VITAMIN D 25 HYDROXY (VIT D DEFICIENCY, FRACTURES)

## 2022-09-05 MED ORDER — METRONIDAZOLE 0.75 % EX GEL
1.0000 | Freq: Two times a day (BID) | CUTANEOUS | 0 refills | Status: DC
Start: 2022-09-05 — End: 2023-01-17
  Filled 2022-09-05: qty 45, 23d supply, fill #0

## 2022-09-05 MED ORDER — PHENTERMINE HCL 37.5 MG PO TABS
37.5000 mg | ORAL_TABLET | Freq: Every day | ORAL | 0 refills | Status: DC
Start: 2022-09-05 — End: 2023-01-04
  Filled 2022-09-05 – 2022-10-14 (×2): qty 90, 90d supply, fill #0

## 2022-09-05 NOTE — Progress Notes (Signed)
Shawna Clamp, DNP, AGNP-c [facility] [address] [place], [place] [address] Main Office [phone]  BP 126/80   Pulse 84   Ht 5' 6.25" (1.683 m)   Wt 158 lb 9.6 oz (71.9 kg)   BMI 25.41 kg/m    Subjective:    Patient ID: INITA MCMURTRY, female    DOB: 1980-03-09, 42 y.o.   MRN: [id number]  HPI: Heather Wallace is a 42 y.o. female presenting on 09/05/2022 for comprehensive medical examination.   Current medical concerns include: Stephaniemarie tells em that she has developed rosacea in the last 6 months. She tells me that it seems to be stress related and  She has tried over the counter hydrocortisone and prosacea and this was no tparticularly helpful. She feels that eucerin cream helps calm some.  She also reports intermittent night sweats. No other symptoms.   Left calf. Experienced a charley horse in the middle of the night Friday night that woke her from her sleep. Since that time the calf has been incredibly sore. Took awalk on Sunday and this did not help loosen it up. Recenty took a 2 week road trip. No shortness of breath or history of blood clots. Is on OCP.    Pertinent items are noted in HPI.  IMMUNIZATIONS:   Flu: Flu vaccine postponed until flu season Prevnar 13: Prevnar 13 N/A for this patient Prevnar 20: Prevnar 20 N/A for this patient Pneumovax 23: Pneumovax 23 N/A for this patient Vac Shingrix: Shingrix N/A for this patient HPV: HPV N/A for this patient Tetanus: Tetanus completed in the last 10 years COVID: COVID completed, documentation in chart   HEALTH MAINTENANCE: Pap Smear HM Status: is up to date Mammogram HM Status: is up to date Colon Cancer Screening HM Status: is not applicable for this patient Bone Density HM Status: is not applicable for this patient STI Testing HM Status: was declined  Lung CT HM Status: is not applicable for this patient  She reports regular vision exams q1-5y: Yes  She reports regular dental exams q 47m:  Yes   The patient eats a regular, healthy diet. She endorses exercise and/or activity of: routine   Most Recent Depression Screen:     09/05/2022   11:13 AM 05/10/2021   12:48 PM 03/09/2020    2:51 PM 03/26/2018   10:56 AM 02/14/2017   10:00 AM  Depression screen PHQ 2/9  Decreased Interest 0 0 0 0 0  Down, Depressed, Hopeless 0 0 0 0 0  PHQ - 2 Score 0 0 0 0 0   Most Recent Anxiety Screen:      No data to display         Most Recent Fall Screen:    09/05/2022   11:13 AM 05/10/2021   12:48 PM 03/09/2020    2:51 PM  Fall Risk   Falls in the past year? 0 0 0  Number falls in past yr: 0 0 0  Injury with Fall? 0 0 0  Risk for fall due to : No Fall Risks No Fall Risks   Follow up Falls evaluation completed Falls evaluation completed Falls evaluation completed    Past medical history, surgical history, medications, allergies, family history and social history reviewed with patient today and changes made to appropriate areas of the chart.  Past Medical History:  Past Medical History:  Diagnosis Date   Ankle sprain 05/21/2020   History of premature delivery 03/26/2018   Kidney stone 08/07  Kidney stone 01/31/2005   Non-recurrent acute suppurative otitis media of right ear without spontaneous rupture of tympanic membrane 05/10/2021   Rectal fissure    Wheezing 05/10/2021   Medications:  Current Outpatient Medications on File Prior to Visit  Medication Sig   levocetirizine (XYZAL) 5 MG tablet Take 1 tablet (5 mg total) by mouth every evening.   norethindrone-ethinyl estradiol-FE (BLISOVI FE 1/20) 1-20 MG-MCG tablet Take 1 tablet by mouth daily.   norethindrone-ethinyl estradiol-FE (BLISOVI FE 1/20) 1-20 MG-MCG tablet Take 1 tablet by mouth daily. Take continously.   olmesartan-hydrochlorothiazide (BENICAR HCT) 40-25 MG tablet Take 1 tablet by mouth daily.   No current facility-administered medications on file prior to visit.   Surgical History:  No past surgical history on  file. Allergies:  No Known Allergies Family History:  Family History  Problem Relation Age of Onset   Hypertension Father        Objective:    BP 126/80   Pulse 84   Ht 5' 6.25" (1.683 m)   Wt 158 lb 9.6 oz (71.9 kg)   BMI 25.41 kg/m   Wt Readings from Last 3 Encounters:  09/05/22 158 lb 9.6 oz (71.9 kg)  05/25/21 174 lb (78.9 kg)  05/04/21 179 lb 12.8 oz (81.6 kg)    Physical Exam Vitals and nursing note reviewed.  Constitutional:      General: She is not in acute distress.    Appearance: Normal appearance.  HENT:     Head: Normocephalic and atraumatic.     Right Ear: Hearing, tympanic membrane, ear canal and external ear normal.     Left Ear: Hearing, tympanic membrane, ear canal and external ear normal.     Nose: Nose normal.     Right Sinus: No maxillary sinus tenderness or frontal sinus tenderness.     Left Sinus: No maxillary sinus tenderness or frontal sinus tenderness.     Mouth/Throat:     Lips: Pink.     Mouth: Mucous membranes are moist.     Pharynx: Oropharynx is clear.  Eyes:     General: Lids are normal. Vision grossly intact.     Extraocular Movements: Extraocular movements intact.     Conjunctiva/sclera: Conjunctivae normal.     Pupils: Pupils are equal, round, and reactive to light.     Funduscopic exam:    Right eye: Red reflex present.        Left eye: Red reflex present.    Visual Fields: Right eye visual fields normal and left eye visual fields normal.  Neck:     Thyroid: No thyromegaly.     Vascular: No carotid bruit.  Cardiovascular:     Rate and Rhythm: Normal rate and regular rhythm.     Chest Wall: PMI is not displaced.     Pulses: Normal pulses.          Dorsalis pedis pulses are 2+ on the right side and 2+ on the left side.       Posterior tibial pulses are 2+ on the right side and 2+ on the left side.     Heart sounds: Normal heart sounds. No murmur heard. Pulmonary:     Effort: Pulmonary effort is normal. No respiratory  distress.     Breath sounds: Normal breath sounds.  Abdominal:     General: Abdomen is flat. Bowel sounds are normal. There is no distension.     Palpations: Abdomen is soft. There is no hepatomegaly, splenomegaly or mass.  Tenderness: There is no abdominal tenderness. There is no right CVA tenderness, left CVA tenderness, guarding or rebound.  Musculoskeletal:        General: Normal range of motion.     Cervical back: Full passive range of motion without pain, normal range of motion and neck supple. No tenderness.     Right lower leg: No edema.     Left lower leg: No edema.  Feet:     Left foot:     Toenail Condition: Left toenails are normal.  Lymphadenopathy:     Cervical: No cervical adenopathy.     Upper Body:     Right upper body: No supraclavicular adenopathy.     Left upper body: No supraclavicular adenopathy.  Skin:    General: Skin is warm and dry.     Capillary Refill: Capillary refill takes less than 2 seconds.     Nails: There is no clubbing.  Neurological:     General: No focal deficit present.     Mental Status: She is alert and oriented to person, place, and time.     GCS: GCS eye subscore is 4. GCS verbal subscore is 5. GCS motor subscore is 6.     Sensory: Sensation is intact.     Motor: Motor function is intact.     Coordination: Coordination is intact.     Gait: Gait is intact.     Deep Tendon Reflexes: Reflexes are normal and symmetric.  Psychiatric:        Attention and Perception: Attention normal.        Mood and Affect: Mood normal.        Speech: Speech normal.        Behavior: Behavior normal. Behavior is cooperative.        Thought Content: Thought content normal.        Cognition and Memory: Cognition and memory normal.        Judgment: Judgment normal.     Results for orders placed or performed in visit on 09/05/22  CBC with Differential/Platelet  Result Value Ref Range   WBC 9.0 3.4 - 10.8 x10E3/uL   RBC 4.70 3.77 - 5.28 x10E6/uL    Hemoglobin 14.5 11.1 - 15.9 g/dL   Hematocrit 78.2 95.6 - 46.6 %   MCV 92 79 - 97 fL   MCH 30.9 26.6 - 33.0 pg   MCHC 33.4 31.5 - 35.7 g/dL   RDW 21.3 (L) 08.6 - 57.8 %   Platelets 267 150 - 450 x10E3/uL   Neutrophils 75 Not Estab. %   Lymphs 17 Not Estab. %   Monocytes 7 Not Estab. %   Eos 1 Not Estab. %   Basos 0 Not Estab. %   Neutrophils Absolute 6.7 1.4 - 7.0 x10E3/uL   Lymphocytes Absolute 1.5 0.7 - 3.1 x10E3/uL   Monocytes Absolute 0.6 0.1 - 0.9 x10E3/uL   EOS (ABSOLUTE) 0.1 0.0 - 0.4 x10E3/uL   Basophils Absolute 0.0 0.0 - 0.2 x10E3/uL   Immature Granulocytes 0 Not Estab. %   Immature Grans (Abs) 0.0 0.0 - 0.1 x10E3/uL  Comprehensive metabolic panel  Result Value Ref Range   Glucose 82 70 - 99 mg/dL   BUN 8 6 - 24 mg/dL   Creatinine, Ser 4.69 0.57 - 1.00 mg/dL   eGFR 629 >52 WU/XLK/4.40   BUN/Creatinine Ratio 11 9 - 23   Sodium 141 134 - 144 mmol/L   Potassium 4.3 3.5 - 5.2 mmol/L   Chloride 101 96 -  106 mmol/L   CO2 22 20 - 29 mmol/L   Calcium 9.9 8.7 - 10.2 mg/dL   Total Protein 7.4 6.0 - 8.5 g/dL   Albumin 4.8 3.9 - 4.9 g/dL   Globulin, Total 2.6 1.5 - 4.5 g/dL   Bilirubin Total 0.6 0.0 - 1.2 mg/dL   Alkaline Phosphatase 76 44 - 121 IU/L   AST 13 0 - 40 IU/L   ALT 10 0 - 32 IU/L  Lipid panel  Result Value Ref Range   Cholesterol, Total 174 100 - 199 mg/dL   Triglycerides 95 0 - 149 mg/dL   HDL 55 >24 mg/dL   VLDL Cholesterol Cal 17 5 - 40 mg/dL   LDL Chol Calc (NIH) 401 (H) 0 - 99 mg/dL   Chol/HDL Ratio 3.2 0.0 - 4.4 ratio  TSH  Result Value Ref Range   TSH 0.726 0.450 - 4.500 uIU/mL  Hepatitis C antibody  Result Value Ref Range   Hep C Virus Ab Non Reactive Non Reactive  HIV Antibody (routine testing w rflx)  Result Value Ref Range   HIV Screen 4th Generation wRfx Non Reactive Non Reactive  VITAMIN D 25 Hydroxy (Vit-D Deficiency, Fractures)  Result Value Ref Range   Vit D, 25-Hydroxy 28.5 (L) 30.0 - 100.0 ng/mL  D-dimer, quantitative  Result  Value Ref Range   D-DIMER <0.20 0.00 - 0.49 mg/L FEU         Assessment & Plan:   Problem List Items Addressed This Visit     Essential hypertension    Blood pressures are controlled at this time with no alarm symptoms present. Continue benicar and close monitoring. Labs pending.       Weight gain    Diet and exercise management have not been successful in weight reduction. Medication options discussed. At this time, her BP is well controlled. We will start phentermine with close monitoring of blood pressure and continue to work on diet and exercise.       Pain of left calf    Left calf pain following cramping several nights ago. At this time there is no erythema or warmth present, but she has had a recent long trip and is on OCP. We will check a d-dimer and consider ultrasound based on results and symptoms.       Relevant Orders   D-dimer, quantitative (Completed)   Encounter for annual physical exam - Primary    CPE completed today. Review of HM activities and recommendations discussed and provided on AVS. Anticipatory guidance, diet, and exercise recommendations provided. Medications, allergies, and hx reviewed and updated as necessary. Orders placed as listed below.  Plan: - Labs ordered. Will make changes as necessary based on results.  - I will review these results and send recommendations via MyChart or a telephone call.  - F/U with CPE in 1 year or sooner for acute/chronic health needs as directed.        Relevant Orders   CBC with Differential/Platelet (Completed)   Comprehensive metabolic panel (Completed)   Lipid panel (Completed)   TSH (Completed)   Hepatitis C antibody (Completed)   HIV Antibody (routine testing w rflx) (Completed)   VITAMIN D 25 Hydroxy (Vit-D Deficiency, Fractures) (Completed)   D-dimer, quantitative (Completed)   Rosacea, unspecified    Symptoms and presentation consistent with rosacea. Will trial metronidazole in addition to Eucerin. If no  improvement recommend f/u.      Relevant Medications   metroNIDAZOLE (METROGEL) 0.75 % gel  Unexplained night sweats   Relevant Medications   phentermine (ADIPEX-P) 37.5 MG tablet       Follow up plan: Return in about 1 year (around 09/05/2023) for CPE.  NEXT PREVENTATIVE PHYSICAL DUE IN 1 YEAR.  PATIENT COUNSELING PROVIDED FOR ALL ADULT PATIENTS: A well balanced diet low in saturated fats, cholesterol, and moderation in carbohydrates.  This can be as simple as monitoring portion sizes and cutting back on sugary beverages such as soda and juice to start with.    Daily water consumption of at least 64 ounces.  Physical activity at least 180 minutes per week.  If just starting out, start 10 minutes a day and work your way up.   This can be as simple as taking the stairs instead of the elevator and walking 2-3 laps around the office  purposefully every day.   STD protection, partner selection, and regular testing if high risk.  Limited consumption of alcoholic beverages if alcohol is consumed. For men, I recommend no more than 14 alcoholic beverages per week, spread out throughout the week (max 2 per day). Avoid "binge" drinking or consuming large quantities of alcohol in one setting.  Please let me know if you feel you may need help with reduction or quitting alcohol consumption.   Avoidance of nicotine, if used. Please let me know if you feel you may need help with reduction or quitting nicotine use.   Daily mental health attention. This can be in the form of 5 minute daily meditation, prayer, journaling, yoga, reflection, etc.  Purposeful attention to your emotions and mental state can significantly improve your overall wellbeing  and  Health.  Please know that I am here to help you with all of your health care goals and am happy to work with you to find a solution that works best for you.  The greatest advice I have received with any changes in life are to take it one step  at a time, that even means if all you can focus on is the next 60 seconds, then do that and celebrate your victories.  With any changes in life, you will have set backs, and that is OK. The important thing to remember is, if you have a set back, it is not a failure, it is an opportunity to try again! Screening Testing Mammogram Every 1 -2 years based on history and risk factors Starting at age 65 Pap Smear Ages 21-39 every 3 years Ages 93-65 every 5 years with HPV testing More frequent testing may be required based on results and history Colon Cancer Screening Every 1-10 years based on test performed, risk factors, and history Starting at age 36 Bone Density Screening Every 2-10 years based on history Starting at age 60 for women Recommendations for men differ based on medication usage, history, and risk factors AAA Screening One time ultrasound Men 23-56 years old who have every smoked Lung Cancer Screening Low Dose Lung CT every 12 months Age 63-80 years with a 30 pack-year smoking history who still smoke or who have quit within the last 15 years   Screening Labs Routine  Labs: Complete Blood Count (CBC), Complete Metabolic Panel (CMP), Cholesterol (Lipid Panel) Every 6-12 months based on history and medications May be recommended more frequently based on current conditions or previous results Hemoglobin A1c Lab Every 3-12 months based on history and previous results Starting at age 63 or earlier with diagnosis of diabetes, high cholesterol, BMI >26, and/or risk factors Frequent monitoring  for patients with diabetes to ensure blood sugar control Thyroid Panel (TSH) Every 6 months based on history, symptoms, and risk factors May be repeated more often if on medication HIV One time testing for all patients 41 and older May be repeated more frequently for patients with increased risk factors or exposure Hepatitis C One time testing for all patients 35 and older May be repeated  more frequently for patients with increased risk factors or exposure Gonorrhea, Chlamydia Every 12 months for all sexually active persons 13-24 years Additional monitoring may be recommended for those who are considered high risk or who have symptoms Every 12 months for any woman on birth control, regardless of sexual activity PSA Men 34-74 years old with risk factors Additional screening may be recommended from age 31-69 based on risk factors, symptoms, and history  Vaccine Recommendations Tetanus Booster All adults every 10 years Flu Vaccine All patients 6 months and older every year COVID Vaccine All patients 12 years and older Initial dosing with booster May recommend additional booster based on age and health history HPV Vaccine 2 doses all patients age 35-26 Dosing may be considered for patients over 26 Shingles Vaccine (Shingrix) 2 doses all adults 55 years and older Pneumonia (Pneumovax 23) All adults 65 years and older May recommend earlier dosing based on health history One year apart from Prevnar 13 Pneumonia (Prevnar 68) All adults 65 years and older Dosed 1 year after Pneumovax 23 Pneumonia (Prevnar 20) One time alternative to the two dosing of 13 and 23 For all adults with initial dose of 23, 20 is recommended 1 year later For all adults with initial dose of 13, 23 is still recommended as second option 1 year later

## 2022-09-05 NOTE — Patient Instructions (Signed)
Muscle Cramps and Spasms Muscle cramps and spasms occur when a muscle or muscles tighten and you have no control over this tightening (involuntary muscle contraction). They are a common problem that can happen in any muscle. The most common place is in the calf muscles of the leg. There are a few ways that muscle cramps and spasms differ: Muscle cramps are painful. They come and go and may last for a few seconds or up to 15 minutes. Muscle cramps are often more forceful and last longer than muscle spasms. Muscle spasms may or may not be painful. They may last just a few seconds or last much longer. Certain conditions, such as diabetes or Parkinson's disease, can make you more likely to have cramps or spasms. But in most cases, cramps and spasms are not caused by other conditions. Common causes include: Overexertion. This is when you do more physical work or exercise than your body is ready for. Overuse from doing the same movements too many times. Staying in one position for too long. Improper preparation, form, or technique when playing a sport or doing an activity. Not enough water or other fluids in your body (dehydration). Other causes may include: Injury. Side effects of some medicines. Too few salts and minerals in your body (electrolytes), such as potassium and calcium. This could happen if you are taking water pills (diuretics) or if you are pregnant. In many cases, the cause of muscle cramps or spasms is not known. Follow these instructions at home: Eating and drinking Drink enough fluid to keep your pee (urine) pale yellow. This can help prevent cramps or spasms. Eat a healthy diet that includes a lot of nutrients to help your muscles work. A healthy diet includes fruits and vegetables, lean protein, whole grains, and low-fat or nonfat dairy products. Managing pain and stiffness     Try to massage, stretch, and relax the affected muscle. Do this for a few minutes at a time. If told,  put ice on the muscles. This may help if you are sore or have pain after a cramp or spasm. Put ice in a plastic bag. Place a towel between your skin and the bag. Leave the ice on for 20 minutes, 2-3 times a day. If told, apply heat to tight or tense muscles as often as told by your health care provider. Use the heat source that your provider recommends, such as a moist heat pack or a heating pad. Place a towel between your skin and the heat source. Leave the heat on for 20-30 minutes. If your skin turns bright red, remove the ice or heat right away to prevent skin damage. The risk of damage is higher if you cannot feel pain, heat, or cold. Take hot showers or baths to help relax tight muscles. General instructions If you are having cramps often, avoid intense exercise for a few days. Take over-the-counter and prescription medicines only as told by your provider. Watch for any changes in your symptoms. Contact a health care provider if: Your cramps or spasms get more severe or happen more often. Your cramps or spasms do not get better over time. This information is not intended to replace advice given to you by your health care provider. Make sure you discuss any questions you have with your health care provider. Document Revised: 09/07/2021 Document Reviewed: 09/07/2021 Elsevier Patient Education  2024 [employer]  

## 2022-09-14 DIAGNOSIS — L719 Rosacea, unspecified: Secondary | ICD-10-CM | POA: Insufficient documentation

## 2022-09-14 DIAGNOSIS — M79662 Pain in left lower leg: Secondary | ICD-10-CM | POA: Insufficient documentation

## 2022-09-14 DIAGNOSIS — Z0000 Encounter for general adult medical examination without abnormal findings: Secondary | ICD-10-CM | POA: Insufficient documentation

## 2022-09-14 NOTE — Assessment & Plan Note (Signed)
Symptoms and presentation consistent with rosacea. Will trial metronidazole in addition to Eucerin. If no improvement recommend f/u.

## 2022-09-14 NOTE — Assessment & Plan Note (Signed)
Diet and exercise management have not been successful in weight reduction. Medication options discussed. At this time, her BP is well controlled. We will start phentermine with close monitoring of blood pressure and continue to work on diet and exercise.

## 2022-09-14 NOTE — Assessment & Plan Note (Signed)
Left calf pain following cramping several nights ago. At this time there is no erythema or warmth present, but she has had a recent long trip and is on OCP. We will check a d-dimer and consider ultrasound based on results and symptoms.

## 2022-09-14 NOTE — Assessment & Plan Note (Signed)
Blood pressures are controlled at this time with no alarm symptoms present. Continue benicar and close monitoring. Labs pending.

## 2022-09-14 NOTE — Assessment & Plan Note (Signed)

## 2022-10-14 ENCOUNTER — Other Ambulatory Visit (HOSPITAL_BASED_OUTPATIENT_CLINIC_OR_DEPARTMENT_OTHER): Payer: Self-pay | Admitting: Nurse Practitioner

## 2022-10-14 ENCOUNTER — Other Ambulatory Visit: Payer: Self-pay

## 2022-10-14 ENCOUNTER — Other Ambulatory Visit (HOSPITAL_BASED_OUTPATIENT_CLINIC_OR_DEPARTMENT_OTHER): Payer: Self-pay

## 2022-10-14 DIAGNOSIS — H6991 Unspecified Eustachian tube disorder, right ear: Secondary | ICD-10-CM

## 2022-10-14 MED ORDER — LEVOCETIRIZINE DIHYDROCHLORIDE 5 MG PO TABS
5.0000 mg | ORAL_TABLET | Freq: Every evening | ORAL | 3 refills | Status: DC
Start: 2022-10-14 — End: 2023-10-19
  Filled 2022-10-14: qty 90, 90d supply, fill #0
  Filled 2023-01-23: qty 90, 90d supply, fill #1
  Filled 2023-04-25: qty 90, 90d supply, fill #2
  Filled 2023-08-24 – 2023-08-31 (×4): qty 90, 90d supply, fill #3

## 2022-11-10 ENCOUNTER — Other Ambulatory Visit (HOSPITAL_BASED_OUTPATIENT_CLINIC_OR_DEPARTMENT_OTHER): Payer: Self-pay

## 2022-11-10 MED ORDER — INFLUENZA VIRUS VACC SPLIT PF (FLUZONE) 0.5 ML IM SUSY
0.5000 mL | PREFILLED_SYRINGE | Freq: Once | INTRAMUSCULAR | 0 refills | Status: AC
  Filled 2022-11-10: qty 0.5, 1d supply, fill #0

## 2022-11-15 ENCOUNTER — Other Ambulatory Visit (HOSPITAL_BASED_OUTPATIENT_CLINIC_OR_DEPARTMENT_OTHER): Payer: Self-pay

## 2022-11-15 ENCOUNTER — Other Ambulatory Visit: Payer: Self-pay

## 2023-01-04 ENCOUNTER — Other Ambulatory Visit: Payer: Self-pay | Admitting: Nurse Practitioner

## 2023-01-04 DIAGNOSIS — R61 Generalized hyperhidrosis: Secondary | ICD-10-CM

## 2023-01-04 NOTE — Telephone Encounter (Signed)
 Last apt 09/05/22.

## 2023-01-12 ENCOUNTER — Other Ambulatory Visit (HOSPITAL_BASED_OUTPATIENT_CLINIC_OR_DEPARTMENT_OTHER): Payer: Self-pay

## 2023-01-12 MED ORDER — PHENTERMINE HCL 37.5 MG PO TABS
37.5000 mg | ORAL_TABLET | Freq: Every day | ORAL | 0 refills | Status: DC
Start: 2023-01-12 — End: 2023-09-06
  Filled 2023-01-12: qty 90, 90d supply, fill #0

## 2023-01-17 ENCOUNTER — Telehealth: Payer: Self-pay

## 2023-01-17 ENCOUNTER — Other Ambulatory Visit: Payer: Self-pay

## 2023-01-17 DIAGNOSIS — L719 Rosacea, unspecified: Secondary | ICD-10-CM

## 2023-01-17 MED ORDER — METRONIDAZOLE 0.75 % EX GEL
1.0000 | Freq: Two times a day (BID) | CUTANEOUS | 0 refills | Status: AC
Start: 2023-01-17 — End: ?

## 2023-01-17 NOTE — Telephone Encounter (Signed)
Faxed request for metronidazole 0.75% gel

## 2023-01-23 ENCOUNTER — Other Ambulatory Visit (HOSPITAL_BASED_OUTPATIENT_CLINIC_OR_DEPARTMENT_OTHER): Payer: Self-pay

## 2023-01-23 ENCOUNTER — Other Ambulatory Visit: Payer: Self-pay

## 2023-04-04 ENCOUNTER — Other Ambulatory Visit (HOSPITAL_BASED_OUTPATIENT_CLINIC_OR_DEPARTMENT_OTHER): Payer: Self-pay

## 2023-04-19 ENCOUNTER — Other Ambulatory Visit: Payer: Self-pay | Admitting: Nurse Practitioner

## 2023-04-19 ENCOUNTER — Other Ambulatory Visit (HOSPITAL_BASED_OUTPATIENT_CLINIC_OR_DEPARTMENT_OTHER): Payer: Self-pay

## 2023-04-19 DIAGNOSIS — F418 Other specified anxiety disorders: Secondary | ICD-10-CM

## 2023-04-19 MED ORDER — PROPRANOLOL HCL 20 MG PO TABS
ORAL_TABLET | ORAL | 3 refills | Status: AC
Start: 2023-04-19 — End: ?
  Filled 2023-04-19: qty 20, 20d supply, fill #0

## 2023-06-27 ENCOUNTER — Other Ambulatory Visit: Payer: Self-pay

## 2023-07-07 ENCOUNTER — Other Ambulatory Visit (HOSPITAL_BASED_OUTPATIENT_CLINIC_OR_DEPARTMENT_OTHER): Payer: Self-pay

## 2023-07-07 DIAGNOSIS — Z3041 Encounter for surveillance of contraceptive pills: Secondary | ICD-10-CM | POA: Diagnosis not present

## 2023-07-07 DIAGNOSIS — Z130 Encounter for screening for diseases of the blood and blood-forming organs and certain disorders involving the immune mechanism: Secondary | ICD-10-CM | POA: Diagnosis not present

## 2023-07-07 DIAGNOSIS — Z01419 Encounter for gynecological examination (general) (routine) without abnormal findings: Secondary | ICD-10-CM | POA: Diagnosis not present

## 2023-07-07 DIAGNOSIS — Z1231 Encounter for screening mammogram for malignant neoplasm of breast: Secondary | ICD-10-CM | POA: Diagnosis not present

## 2023-07-07 DIAGNOSIS — Z1389 Encounter for screening for other disorder: Secondary | ICD-10-CM | POA: Diagnosis not present

## 2023-07-07 MED ORDER — NORETHIN ACE-ETH ESTRAD-FE 1-20 MG-MCG PO TABS
1.0000 | ORAL_TABLET | Freq: Every day | ORAL | 5 refills | Status: AC
  Filled 2023-07-07: qty 112, 84d supply, fill #0
  Filled 2023-09-27: qty 112, 84d supply, fill #1
  Filled 2023-12-12 – 2023-12-13 (×2): qty 112, 84d supply, fill #2

## 2023-07-14 ENCOUNTER — Other Ambulatory Visit: Payer: Self-pay

## 2023-07-14 ENCOUNTER — Other Ambulatory Visit (HOSPITAL_BASED_OUTPATIENT_CLINIC_OR_DEPARTMENT_OTHER): Payer: Self-pay

## 2023-07-17 ENCOUNTER — Other Ambulatory Visit: Payer: Self-pay

## 2023-08-22 ENCOUNTER — Other Ambulatory Visit (HOSPITAL_BASED_OUTPATIENT_CLINIC_OR_DEPARTMENT_OTHER): Payer: Self-pay

## 2023-08-22 DIAGNOSIS — H524 Presbyopia: Secondary | ICD-10-CM | POA: Diagnosis not present

## 2023-08-22 DIAGNOSIS — H5212 Myopia, left eye: Secondary | ICD-10-CM | POA: Diagnosis not present

## 2023-08-24 ENCOUNTER — Other Ambulatory Visit: Payer: Self-pay

## 2023-08-25 ENCOUNTER — Other Ambulatory Visit: Payer: Self-pay

## 2023-08-28 ENCOUNTER — Other Ambulatory Visit: Payer: Self-pay

## 2023-08-29 ENCOUNTER — Other Ambulatory Visit (HOSPITAL_BASED_OUTPATIENT_CLINIC_OR_DEPARTMENT_OTHER): Payer: Self-pay

## 2023-08-29 ENCOUNTER — Other Ambulatory Visit: Payer: Self-pay

## 2023-08-31 ENCOUNTER — Other Ambulatory Visit: Payer: Self-pay

## 2023-09-05 NOTE — Progress Notes (Unsigned)
 Last PAP: HPV:   [name], DNP, AGNP-c [facility] [address] [place], [place] [address] Main Office [phone]  BP 122/80   Pulse 76   Ht 5' 6.5 (1.689 m)   Wt 169 lb 6.4 oz (76.8 kg)   BMI 26.93 kg/m    Subjective:    Patient ID: Heather Wallace, female    DOB: 12-Mar-1980, 43 y.o.   MRN: [id number]  HPI: History of Present Illness Heather Wallace is a 43 year old female who presents for her annual physical exam.  She has not taken her BP medication yet today, but pressures are good. She mentions experiencing an episode of dizziness in the past week. She has not been regularly monitoring her blood pressure at home. She is currently taking Benicar  for hypertension and uses propranolol  as needed for presentations. No chest pain, shortness of breath, swelling in the feet or ankles, fullness in the throat, or changes in bowel habits. She plans to monitor her BP and if it appears to be dropping, she will let me know.   She has been experiencing right ear pain for about a week, which she associates with not taking her allergy medication. No fever or chills. During spring and fall, she takes Xyzal  daily to manage her symptoms. She did notice some pain into the jaw on the right side this morning upon awakening, which is common with otitis.   She is experiencing perimenopausal symptoms, noting that they can be challenging. She has added an extra fan in her bedroom to help manage hot flashes at night. She is currently taking birth control continuously, which she believes helps manage some symptoms. She is not have menstrual cycles with the continuous birth control. She is seeing OB GYN.   She recalls a past episode where a lymph node under her arm became significantly enlarged, which resolved after a couple of weeks. She does not remember the exact timing of this event but suspects it was related to a viral infection.  She is currently completing a master's program in  healthcare administration and is looking forward to resuming her workout routine once she finishes (in 1 week).   Pertinent items are noted in HPI.   Most Recent Depression Screen:     09/06/2023    8:27 AM 09/05/2022   11:13 AM 05/10/2021   12:48 PM 03/09/2020    2:51 PM 03/26/2018   10:56 AM  Depression screen PHQ 2/9  Decreased Interest 0 0 0 0 0  Down, Depressed, Hopeless 0 0 0 0 0  PHQ - 2 Score 0 0 0 0 0   Most Recent Anxiety Screen:      No data to display         Most Recent Fall Screen:    09/06/2023    8:27 AM 09/05/2022   11:13 AM 05/10/2021   12:48 PM 03/09/2020    2:51 PM  Fall Risk   Falls in the past year? 0 0 0 0  Number falls in past yr: 0 0 0 0  Injury with Fall? 0 0 0 0  Risk for fall due to : No Fall Risks No Fall Risks No Fall Risks   Follow up Falls evaluation completed Falls evaluation completed Falls evaluation completed  Falls evaluation completed      Data saved with a previous flowsheet row definition    Past medical history, surgical history, medications, allergies, family history and social history reviewed with patient today and changes  made to appropriate areas of the chart.  Past Medical History:  Past Medical History:  Diagnosis Date   Ankle sprain 05/21/2020   History of premature delivery 03/26/2018   Kidney stone 08/2005   Kidney stone 01/31/2005   Non-recurrent acute suppurative otitis media of right ear without spontaneous rupture of tympanic membrane 05/10/2021   Protein in urine 05/26/2011   Rectal fissure    Wheezing 05/10/2021   Medications:  Current Outpatient Medications on File Prior to Visit  Medication Sig   levocetirizine (XYZAL ) 5 MG tablet Take 1 tablet (5 mg total) by mouth every evening.   metroNIDAZOLE  (METROGEL ) 0.75 % gel Apply 1 Application topically 2 (two) times daily.   propranolol  (INDERAL ) 20 MG tablet Take ONE-HALF (10 mg total) 30 minutes prior to event. May increase by 10 mg up to a total of 40 mg (2 tabs) in  single dose if smaller doses are not effective.   norethindrone -ethinyl estradiol -FE (BLISOVI  FE 1/20) 1-20 MG-MCG tablet Take 1 tablet by mouth daily continuously (Patient not taking: Reported on 09/06/2023)   olmesartan -hydrochlorothiazide  (BENICAR  HCT) 40-25 MG tablet Take 1 tablet by mouth daily. (Patient not taking: Reported on 09/06/2023)   No current facility-administered medications on file prior to visit.   Surgical History:  No past surgical history on file. Allergies:  No Known Allergies Family History:  Family History  Problem Relation Age of Onset   Hypertension Father        Objective:    BP 122/80   Pulse 76   Ht 5' 6.5 (1.689 m)   Wt 169 lb 6.4 oz (76.8 kg)   BMI 26.93 kg/m   Wt Readings from Last 3 Encounters:  09/06/23 169 lb 6.4 oz (76.8 kg)  09/05/22 158 lb 9.6 oz (71.9 kg)  05/25/21 174 lb (78.9 kg)    Physical Exam Vitals and nursing note reviewed.  Constitutional:      General: She is not in acute distress.    Appearance: Normal appearance.  HENT:     Head: Normocephalic and atraumatic.     Jaw: Tenderness present.     Right Ear: Hearing, ear canal and external ear normal. Tenderness present. No swelling. A middle ear effusion is present. Tympanic membrane is bulging.     Left Ear: Hearing, tympanic membrane, ear canal and external ear normal.     Nose: Nose normal.     Right Sinus: No maxillary sinus tenderness or frontal sinus tenderness.     Left Sinus: No maxillary sinus tenderness or frontal sinus tenderness.     Mouth/Throat:     Lips: Pink.     Mouth: Mucous membranes are moist.     Pharynx: Oropharynx is clear.  Eyes:     General: Lids are normal. Vision grossly intact.     Extraocular Movements: Extraocular movements intact.     Conjunctiva/sclera: Conjunctivae normal.     Pupils: Pupils are equal, round, and reactive to light.     Funduscopic exam:    Right eye: Red reflex present.        Left eye: Red reflex present.    Visual  Fields: Right eye visual fields normal and left eye visual fields normal.  Neck:     Thyroid : No thyromegaly.     Vascular: No carotid bruit.  Cardiovascular:     Rate and Rhythm: Normal rate and regular rhythm.     Chest Wall: PMI is not displaced.     Pulses: Normal pulses.  Dorsalis pedis pulses are 2+ on the right side and 2+ on the left side.       Posterior tibial pulses are 2+ on the right side and 2+ on the left side.     Heart sounds: Normal heart sounds. No murmur heard. Pulmonary:     Effort: Pulmonary effort is normal. No respiratory distress.     Breath sounds: Normal breath sounds.  Abdominal:     General: Abdomen is flat. Bowel sounds are normal. There is no distension.     Palpations: Abdomen is soft. There is no hepatomegaly, splenomegaly or mass.     Tenderness: There is no abdominal tenderness. There is no right CVA tenderness, left CVA tenderness, guarding or rebound.  Musculoskeletal:        General: Normal range of motion.     Cervical back: Full passive range of motion without pain, normal range of motion and neck supple. No tenderness.     Right lower leg: No edema.     Left lower leg: No edema.  Feet:     Left foot:     Toenail Condition: Left toenails are normal.  Lymphadenopathy:     Cervical: No cervical adenopathy.     Upper Body:     Right upper body: No supraclavicular adenopathy.     Left upper body: No supraclavicular adenopathy.  Skin:    General: Skin is warm and dry.     Capillary Refill: Capillary refill takes less than 2 seconds.     Nails: There is no clubbing.  Neurological:     General: No focal deficit present.     Mental Status: She is alert and oriented to person, place, and time.     GCS: GCS eye subscore is 4. GCS verbal subscore is 5. GCS motor subscore is 6.     Sensory: Sensation is intact. No sensory deficit.     Motor: Motor function is intact. No weakness.     Coordination: Coordination is intact.     Gait: Gait is  intact. Gait normal.     Deep Tendon Reflexes: Reflexes are normal and symmetric.  Psychiatric:        Attention and Perception: Attention normal.        Mood and Affect: Mood normal.        Speech: Speech normal.        Behavior: Behavior normal. Behavior is cooperative.        Thought Content: Thought content normal.        Cognition and Memory: Cognition and memory normal.        Judgment: Judgment normal.      Results for orders placed or performed in visit on 09/05/22  CBC with Differential/Platelet   Collection Time: 09/05/22 12:15 PM  Result Value Ref Range   WBC 9.0 3.4 - 10.8 x10E3/uL   RBC 4.70 3.77 - 5.28 x10E6/uL   Hemoglobin 14.5 11.1 - 15.9 g/dL   Hematocrit 56.5 65.9 - 46.6 %   MCV 92 79 - 97 fL   MCH 30.9 26.6 - 33.0 pg   MCHC 33.4 31.5 - 35.7 g/dL   RDW 88.7 (L) 88.2 - 84.5 %   Platelets 267 150 - 450 x10E3/uL   Neutrophils 75 Not Estab. %   Lymphs 17 Not Estab. %   Monocytes 7 Not Estab. %   Eos 1 Not Estab. %   Basos 0 Not Estab. %   Neutrophils Absolute 6.7 1.4 - 7.0 x10E3/uL  Lymphocytes Absolute 1.5 0.7 - 3.1 x10E3/uL   Monocytes Absolute 0.6 0.1 - 0.9 x10E3/uL   EOS (ABSOLUTE) 0.1 0.0 - 0.4 x10E3/uL   Basophils Absolute 0.0 0.0 - 0.2 x10E3/uL   Immature Granulocytes 0 Not Estab. %   Immature Grans (Abs) 0.0 0.0 - 0.1 x10E3/uL  Comprehensive metabolic panel   Collection Time: 09/05/22 12:15 PM  Result Value Ref Range   Glucose 82 70 - 99 mg/dL   BUN 8 6 - 24 mg/dL   Creatinine, Ser 9.23 0.57 - 1.00 mg/dL   eGFR 898 >40 fO/fpw/8.26   BUN/Creatinine Ratio 11 9 - 23   Sodium 141 134 - 144 mmol/L   Potassium 4.3 3.5 - 5.2 mmol/L   Chloride 101 96 - 106 mmol/L   CO2 22 20 - 29 mmol/L   Calcium 9.9 8.7 - 10.2 mg/dL   Total Protein 7.4 6.0 - 8.5 g/dL   Albumin 4.8 3.9 - 4.9 g/dL   Globulin, Total 2.6 1.5 - 4.5 g/dL   Bilirubin Total 0.6 0.0 - 1.2 mg/dL   Alkaline Phosphatase 76 44 - 121 IU/L   AST 13 0 - 40 IU/L   ALT 10 0 - 32 IU/L  Lipid  panel   Collection Time: 09/05/22 12:15 PM  Result Value Ref Range   Cholesterol, Total 174 100 - 199 mg/dL   Triglycerides 95 0 - 149 mg/dL   HDL 55 >60 mg/dL   VLDL Cholesterol Cal 17 5 - 40 mg/dL   LDL Chol Calc (NIH) 897 (H) 0 - 99 mg/dL   Chol/HDL Ratio 3.2 0.0 - 4.4 ratio  TSH   Collection Time: 09/05/22 12:15 PM  Result Value Ref Range   TSH 0.726 0.450 - 4.500 uIU/mL  Hepatitis C antibody   Collection Time: 09/05/22 12:15 PM  Result Value Ref Range   Hep C Virus Ab Non Reactive Non Reactive  HIV Antibody (routine testing w rflx)   Collection Time: 09/05/22 12:15 PM  Result Value Ref Range   HIV Screen 4th Generation wRfx Non Reactive Non Reactive  VITAMIN D  25 Hydroxy (Vit-D Deficiency, Fractures)   Collection Time: 09/05/22 12:15 PM  Result Value Ref Range   Vit D, 25-Hydroxy 28.5 (L) 30.0 - 100.0 ng/mL  D-dimer, quantitative   Collection Time: 09/05/22 12:15 PM  Result Value Ref Range   D-DIMER <0.20 0.00 - 0.49 mg/L FEU       Assessment & Plan:   Problem List Items Addressed This Visit     Essential hypertension   Hypertension well-controlled with Benicar . Propranolol  used as needed for presentations. No recent home blood pressure monitoring but plans to start to ensure that BP is not dropping too low.  - Continue Benicar  for hypertension - Use propranolol  as needed for presentations - Start regular home blood pressure monitoring      Relevant Orders   CBC with Differential/Platelet   CMP14+EGFR   Lipid panel   Non-recurrent acute suppurative otitis media of right ear without spontaneous rupture of tympanic membrane   Right ear pain for about a week. Right TM appears erythematous and bulging with purulent effusion present. No fever or chills. Pain occasionally radiates to the jaw. Known eustachian tube dysfunction on the right. Likely related to allergies.  - Augmentin  sent for management.  - Prednisone  20mg  once a day for 5 days if symptoms are not  improving  - Advise to contact if symptoms worsen or do not improve       Relevant  Medications   amoxicillin -clavulanate (AUGMENTIN ) 875-125 MG tablet   predniSONE  (DELTASONE ) 20 MG tablet   fluconazole  (DIFLUCAN ) 150 MG tablet   Perimenopausal symptoms   Experiencing perimenopausal symptoms, including hot flashes. Currently managed with birth control. Discussed Veozah as a potential treatment for hot flashes, but noted it is not typically covered by insurance and is expensive. Symptoms not severe enough to warrant change in management at this time. She will contact us  or GYN if her symptoms worsen or change.  - Continue current birth control regimen       Encounter for annual physical exam - Primary   CPE completed today. Review of HM activities and recommendations discussed and provided on AVS. Anticipatory guidance, diet, and exercise recommendations provided. Medications, allergies, and hx reviewed and updated as necessary. Orders placed as listed below.  Plan: - Labs ordered. Will make changes as necessary based on results.  - I will review these results and send recommendations via MyChart or a telephone call.  - F/U with CPE in 1 year or sooner for acute/chronic health needs as directed.        Non-seasonal allergic rhinitis due to pollen   Chronic allergic rhinitis managed with Xyzal . Symptoms include ear pain potentially related to allergies. No current need for additional allergy medications. - Continue Xyzal  for allergic rhinitis - Monitor symptoms and adjust treatment if necessary      Vitamin D  deficiency   Repeat evaluation today. No concerning symptoms present.       Relevant Orders   VITAMIN D  25 Hydroxy (Vit-D Deficiency, Fractures)   Other Visit Diagnoses       Screening for endocrine, nutritional, metabolic and immunity disorder       Relevant Orders   CBC with Differential/Platelet   CMP14+EGFR   Lipid panel   TSH         Follow up plan: Return  in about 1 year (around 09/05/2024) for CPE.  NEXT PREVENTATIVE PHYSICAL DUE IN 1 YEAR.  PATIENT COUNSELING PROVIDED FOR ALL ADULT PATIENTS: A well balanced diet low in saturated fats, cholesterol, and moderation in carbohydrates.  This can be as simple as monitoring portion sizes and cutting back on sugary beverages such as soda and juice to start with.    Daily water consumption of at least 64 ounces.  Physical activity at least 180 minutes per week.  If just starting out, start 10 minutes a day and work your way up.   This can be as simple as taking the stairs instead of the elevator and walking 2-3 laps around the office  purposefully every day.   STD protection, partner selection, and regular testing if high risk.  Limited consumption of alcoholic beverages if alcohol is consumed. For men, I recommend no more than 14 alcoholic beverages per week, spread out throughout the week (max 2 per day). Avoid binge drinking or consuming large quantities of alcohol in one setting.  Please let me know if you feel you may need help with reduction or quitting alcohol consumption.   Avoidance of nicotine, if used. Please let me know if you feel you may need help with reduction or quitting nicotine use.   Daily mental health attention. This can be in the form of 5 minute daily meditation, prayer, journaling, yoga, reflection, etc.  Purposeful attention to your emotions and mental state can significantly improve your overall wellbeing  and  Health.  Please know that I am here to help you with all of your health  care goals and am happy to work with you to find a solution that works best for you.  The greatest advice I have received with any changes in life are to take it one step at a time, that even means if all you can focus on is the next 60 seconds, then do that and celebrate your victories.  With any changes in life, you will have set backs, and that is OK. The important thing to remember is,  if you have a set back, it is not a failure, it is an opportunity to try again! Screening Testing Mammogram Every 1 -2 years based on history and risk factors Starting at age 25 Pap Smear Ages 21-39 every 3 years Ages 44-65 every 5 years with HPV testing More frequent testing may be required based on results and history Colon Cancer Screening Every 1-10 years based on test performed, risk factors, and history Starting at age 55 Bone Density Screening Every 2-10 years based on history Starting at age 25 for women Recommendations for men differ based on medication usage, history, and risk factors AAA Screening One time ultrasound Men 68-37 years old who have every smoked Lung Cancer Screening Low Dose Lung CT every 12 months Age 52-80 years with a 30 pack-year smoking history who still smoke or who have quit within the last 15 years   Screening Labs Routine  Labs: Complete Blood Count (CBC), Complete Metabolic Panel (CMP), Cholesterol (Lipid Panel) Every 6-12 months based on history and medications May be recommended more frequently based on current conditions or previous results Hemoglobin A1c Lab Every 3-12 months based on history and previous results Starting at age 44 or earlier with diagnosis of diabetes, high cholesterol, BMI >26, and/or risk factors Frequent monitoring for patients with diabetes to ensure blood sugar control Thyroid  Panel (TSH) Every 6 months based on history, symptoms, and risk factors May be repeated more often if on medication HIV One time testing for all patients 60 and older May be repeated more frequently for patients with increased risk factors or exposure Hepatitis C One time testing for all patients 34 and older May be repeated more frequently for patients with increased risk factors or exposure Gonorrhea, Chlamydia Every 12 months for all sexually active persons 13-24 years Additional monitoring may be recommended for those who are considered  high risk or who have symptoms Every 12 months for any woman on birth control, regardless of sexual activity PSA Men 91-41 years old with risk factors Additional screening may be recommended from age 3-69 based on risk factors, symptoms, and history  Vaccine Recommendations Tetanus Booster All adults every 10 years Flu Vaccine All patients 6 months and older every year COVID Vaccine All patients 12 years and older Initial dosing with booster May recommend additional booster based on age and health history HPV Vaccine 2 doses all patients age 76-26 Dosing may be considered for patients over 26 Shingles Vaccine (Shingrix) 2 doses all adults 55 years and older Pneumonia (Pneumovax 69) All adults 65 years and older May recommend earlier dosing based on health history One year apart from Prevnar 61 Pneumonia (Prevnar 4) All adults 65 years and older Dosed 1 year after Pneumovax 23 Pneumonia (Prevnar 20) One time alternative to the two dosing of 13 and 23 For all adults with initial dose of 23, 20 is recommended 1 year later For all adults with initial dose of 13, 23 is still recommended as second option 1 year later

## 2023-09-06 ENCOUNTER — Other Ambulatory Visit (HOSPITAL_BASED_OUTPATIENT_CLINIC_OR_DEPARTMENT_OTHER): Payer: Self-pay

## 2023-09-06 ENCOUNTER — Ambulatory Visit: Payer: 59 | Admitting: Nurse Practitioner

## 2023-09-06 ENCOUNTER — Encounter: Payer: Self-pay | Admitting: Nurse Practitioner

## 2023-09-06 VITALS — BP 122/80 | HR 76 | Ht 66.5 in | Wt 169.4 lb

## 2023-09-06 DIAGNOSIS — J301 Allergic rhinitis due to pollen: Secondary | ICD-10-CM | POA: Diagnosis not present

## 2023-09-06 DIAGNOSIS — Z0000 Encounter for general adult medical examination without abnormal findings: Secondary | ICD-10-CM | POA: Diagnosis not present

## 2023-09-06 DIAGNOSIS — E559 Vitamin D deficiency, unspecified: Secondary | ICD-10-CM

## 2023-09-06 DIAGNOSIS — N951 Menopausal and female climacteric states: Secondary | ICD-10-CM

## 2023-09-06 DIAGNOSIS — I10 Essential (primary) hypertension: Secondary | ICD-10-CM | POA: Diagnosis not present

## 2023-09-06 DIAGNOSIS — Z1321 Encounter for screening for nutritional disorder: Secondary | ICD-10-CM

## 2023-09-06 DIAGNOSIS — Z130 Encounter for screening for diseases of the blood and blood-forming organs and certain disorders involving the immune mechanism: Secondary | ICD-10-CM | POA: Diagnosis not present

## 2023-09-06 DIAGNOSIS — H66001 Acute suppurative otitis media without spontaneous rupture of ear drum, right ear: Secondary | ICD-10-CM

## 2023-09-06 DIAGNOSIS — Z1329 Encounter for screening for other suspected endocrine disorder: Secondary | ICD-10-CM | POA: Diagnosis not present

## 2023-09-06 DIAGNOSIS — Z13228 Encounter for screening for other metabolic disorders: Secondary | ICD-10-CM | POA: Diagnosis not present

## 2023-09-06 MED ORDER — PREDNISONE 20 MG PO TABS
20.0000 mg | ORAL_TABLET | Freq: Every day | ORAL | 0 refills | Status: AC
  Filled 2023-09-06: qty 5, 5d supply, fill #0

## 2023-09-06 MED ORDER — AMOXICILLIN-POT CLAVULANATE 875-125 MG PO TABS
1.0000 | ORAL_TABLET | Freq: Two times a day (BID) | ORAL | 0 refills | Status: AC
  Filled 2023-09-06: qty 20, 10d supply, fill #0

## 2023-09-06 MED ORDER — FLUCONAZOLE 150 MG PO TABS
150.0000 mg | ORAL_TABLET | Freq: Once | ORAL | 0 refills | Status: DC
  Filled 2023-09-06: qty 2, 2d supply, fill #0

## 2023-09-06 NOTE — Assessment & Plan Note (Signed)
 Experiencing perimenopausal symptoms, including hot flashes. Currently managed with birth control. Discussed Veozah as a potential treatment for hot flashes, but noted it is not typically covered by insurance and is expensive. Symptoms not severe enough to warrant change in management at this time. She will contact us  or GYN if her symptoms worsen or change.  - Continue current birth control regimen

## 2023-09-06 NOTE — Assessment & Plan Note (Signed)
 Hypertension well-controlled with Benicar . Propranolol  used as needed for presentations. No recent home blood pressure monitoring but plans to start to ensure that BP is not dropping too low.  - Continue Benicar  for hypertension - Use propranolol  as needed for presentations - Start regular home blood pressure monitoring

## 2023-09-06 NOTE — Assessment & Plan Note (Signed)
 Repeat evaluation today. No concerning symptoms present.

## 2023-09-06 NOTE — Assessment & Plan Note (Signed)

## 2023-09-06 NOTE — Assessment & Plan Note (Signed)
 Right ear pain for about a week. Right TM appears erythematous and bulging with purulent effusion present. No fever or chills. Pain occasionally radiates to the jaw. Known eustachian tube dysfunction on the right. Likely related to allergies.  - Augmentin  sent for management.  - Prednisone  20mg  once a day for 5 days if symptoms are not improving  - Advise to contact if symptoms worsen or do not improve

## 2023-09-06 NOTE — Patient Instructions (Addendum)
 Keep an eye on your blood pressures and let me know if these are running low. We can always split the blood pressure medication or have you hold it for a week and see how they run without it.   I will let you know what your labs show. If there are any concerns we can make changes.  If your ear is not feeling better in the next week, please let me know.

## 2023-09-06 NOTE — Assessment & Plan Note (Signed)
 Chronic allergic rhinitis managed with Xyzal . Symptoms include ear pain potentially related to allergies. No current need for additional allergy medications. - Continue Xyzal  for allergic rhinitis - Monitor symptoms and adjust treatment if necessary

## 2023-09-07 ENCOUNTER — Other Ambulatory Visit (HOSPITAL_BASED_OUTPATIENT_CLINIC_OR_DEPARTMENT_OTHER): Payer: Self-pay

## 2023-09-07 LAB — CMP14+EGFR
ALT: 10 IU/L (ref 0–32)
AST: 15 IU/L (ref 0–40)
Albumin: 4.6 g/dL (ref 3.9–4.9)
Alkaline Phosphatase: 82 IU/L (ref 44–121)
BUN/Creatinine Ratio: 19 (ref 9–23)
BUN: 15 mg/dL (ref 6–24)
Bilirubin Total: 0.6 mg/dL (ref 0.0–1.2)
CO2: 21 mmol/L (ref 20–29)
Calcium: 9.6 mg/dL (ref 8.7–10.2)
Chloride: 99 mmol/L (ref 96–106)
Creatinine, Ser: 0.81 mg/dL (ref 0.57–1.00)
Globulin, Total: 2.7 g/dL (ref 1.5–4.5)
Glucose: 83 mg/dL (ref 70–99)
Potassium: 4 mmol/L (ref 3.5–5.2)
Sodium: 137 mmol/L (ref 134–144)
Total Protein: 7.3 g/dL (ref 6.0–8.5)
eGFR: 93 mL/min/1.73 (ref >59)

## 2023-09-07 LAB — CBC WITH DIFFERENTIAL/PLATELET
Basophils Absolute: 0 x10E3/uL (ref 0.0–0.2)
Basos: 0 %
EOS (ABSOLUTE): 0.1 x10E3/uL (ref 0.0–0.4)
Eos: 2 %
Hematocrit: 47 % — ABNORMAL HIGH (ref 34.0–46.6)
Hemoglobin: 15.5 g/dL (ref 11.1–15.9)
Immature Grans (Abs): 0 x10E3/uL (ref 0.0–0.1)
Immature Granulocytes: 0 %
Lymphocytes Absolute: 1.7 x10E3/uL (ref 0.7–3.1)
Lymphs: 19 %
MCH: 31.3 pg (ref 26.6–33.0)
MCHC: 33 g/dL (ref 31.5–35.7)
MCV: 95 fL (ref 79–97)
Monocytes Absolute: 0.5 x10E3/uL (ref 0.1–0.9)
Monocytes: 6 %
Neutrophils Absolute: 6.2 x10E3/uL (ref 1.4–7.0)
Neutrophils: 73 %
Platelets: 272 x10E3/uL (ref 150–450)
RBC: 4.95 x10E6/uL (ref 3.77–5.28)
RDW: 11.4 % — ABNORMAL LOW (ref 11.7–15.4)
WBC: 8.6 x10E3/uL (ref 3.4–10.8)

## 2023-09-07 LAB — LIPID PANEL
Chol/HDL Ratio: 4 ratio (ref 0.0–4.4)
Cholesterol, Total: 202 mg/dL — ABNORMAL HIGH (ref 100–199)
HDL: 50 mg/dL (ref >39)
LDL Chol Calc (NIH): 133 mg/dL — ABNORMAL HIGH (ref 0–99)
Triglycerides: 107 mg/dL (ref 0–149)
VLDL Cholesterol Cal: 19 mg/dL (ref 5–40)

## 2023-09-07 LAB — TSH: TSH: 0.838 u[IU]/mL (ref 0.450–4.500)

## 2023-09-07 LAB — VITAMIN D 25 HYDROXY (VIT D DEFICIENCY, FRACTURES): Vit D, 25-Hydroxy: 26.8 ng/mL — AB (ref 30.0–100.0)

## 2023-09-12 ENCOUNTER — Other Ambulatory Visit (HOSPITAL_BASED_OUTPATIENT_CLINIC_OR_DEPARTMENT_OTHER): Payer: Self-pay

## 2023-09-12 ENCOUNTER — Ambulatory Visit: Payer: Self-pay | Admitting: Nurse Practitioner

## 2023-09-12 DIAGNOSIS — E7800 Pure hypercholesterolemia, unspecified: Secondary | ICD-10-CM

## 2023-09-12 DIAGNOSIS — E559 Vitamin D deficiency, unspecified: Secondary | ICD-10-CM

## 2023-09-12 MED ORDER — VITAMIN D (ERGOCALCIFEROL) 1.25 MG (50000 UNIT) PO CAPS
50000.0000 [IU] | ORAL_CAPSULE | ORAL | 3 refills | Status: AC
  Filled 2023-09-12: qty 12, 84d supply, fill #0

## 2023-09-15 ENCOUNTER — Other Ambulatory Visit (HOSPITAL_BASED_OUTPATIENT_CLINIC_OR_DEPARTMENT_OTHER): Payer: Self-pay

## 2023-09-27 ENCOUNTER — Other Ambulatory Visit: Payer: Self-pay

## 2023-10-17 ENCOUNTER — Other Ambulatory Visit (HOSPITAL_BASED_OUTPATIENT_CLINIC_OR_DEPARTMENT_OTHER): Payer: Self-pay

## 2023-10-17 MED ORDER — FLUZONE 0.5 ML IM SUSY
0.5000 mL | PREFILLED_SYRINGE | Freq: Once | INTRAMUSCULAR | 0 refills | Status: AC
  Filled 2023-10-17: qty 0.5, 1d supply, fill #0

## 2023-10-19 ENCOUNTER — Other Ambulatory Visit (HOSPITAL_COMMUNITY): Payer: Self-pay

## 2023-10-19 ENCOUNTER — Other Ambulatory Visit: Payer: Self-pay

## 2023-10-19 ENCOUNTER — Other Ambulatory Visit (HOSPITAL_BASED_OUTPATIENT_CLINIC_OR_DEPARTMENT_OTHER): Payer: Self-pay | Admitting: Nurse Practitioner

## 2023-10-19 DIAGNOSIS — H6991 Unspecified Eustachian tube disorder, right ear: Secondary | ICD-10-CM

## 2023-10-19 MED ORDER — LEVOCETIRIZINE DIHYDROCHLORIDE 5 MG PO TABS
5.0000 mg | ORAL_TABLET | Freq: Every evening | ORAL | 3 refills | Status: AC
Start: 2023-10-19 — End: ?
  Filled 2023-10-19: qty 90, 90d supply, fill #0

## 2023-10-20 ENCOUNTER — Other Ambulatory Visit: Payer: Self-pay

## 2023-10-26 ENCOUNTER — Other Ambulatory Visit: Payer: Self-pay

## 2023-10-27 ENCOUNTER — Other Ambulatory Visit: Payer: Self-pay

## 2023-12-13 ENCOUNTER — Other Ambulatory Visit: Payer: Self-pay | Admitting: Nurse Practitioner

## 2023-12-13 ENCOUNTER — Other Ambulatory Visit: Payer: Self-pay

## 2023-12-13 DIAGNOSIS — H66001 Acute suppurative otitis media without spontaneous rupture of ear drum, right ear: Secondary | ICD-10-CM

## 2023-12-14 ENCOUNTER — Other Ambulatory Visit (HOSPITAL_COMMUNITY): Payer: Self-pay

## 2023-12-14 MED ORDER — FLUCONAZOLE 150 MG PO TABS
150.0000 mg | ORAL_TABLET | Freq: Once | ORAL | 2 refills | Status: AC
  Filled 2023-12-14: qty 2, 3d supply, fill #0

## 2024-02-20 ENCOUNTER — Other Ambulatory Visit (HOSPITAL_BASED_OUTPATIENT_CLINIC_OR_DEPARTMENT_OTHER): Payer: Self-pay

## 2024-09-18 ENCOUNTER — Encounter: Payer: Self-pay | Admitting: Nurse Practitioner
